# Patient Record
Sex: Female | Born: 1955 | Race: White | Hispanic: No | Smoking: Never smoker
Health system: Southern US, Community
[De-identification: ages and names within clinical notes are randomized; demographics above are authoritative.]

## PROBLEM LIST (undated history)

## (undated) DIAGNOSIS — E785 Hyperlipidemia, unspecified: Secondary | ICD-10-CM

## (undated) HISTORY — DX: Hyperlipidemia, unspecified: E78.5

---

## 1978-12-04 DIAGNOSIS — A159 Respiratory tuberculosis unspecified: Secondary | ICD-10-CM

## 1978-12-04 HISTORY — DX: Respiratory tuberculosis unspecified: A15.9

## 2020-08-11 ENCOUNTER — Other Ambulatory Visit: Payer: Self-pay

## 2020-08-11 ENCOUNTER — Encounter: Payer: Self-pay | Admitting: Cardiovascular Disease

## 2020-08-11 ENCOUNTER — Ambulatory Visit (INDEPENDENT_AMBULATORY_CARE_PROVIDER_SITE_OTHER): Admitting: Cardiovascular Disease

## 2020-08-11 VITALS — BP 121/78 | HR 83 | Ht 63.0 in | Wt 125.6 lb

## 2020-08-11 DIAGNOSIS — R0602 Shortness of breath: Secondary | ICD-10-CM | POA: Diagnosis not present

## 2020-08-11 DIAGNOSIS — E7800 Pure hypercholesterolemia, unspecified: Secondary | ICD-10-CM | POA: Diagnosis not present

## 2020-08-11 NOTE — Patient Instructions (Signed)
Medication Instructions:  No changes *If you need a refill on your cardiac medications before your next appointment, please call your pharmacy*   Lab Work: None ordered If you have labs (blood work) drawn today and your tests are completely normal, you will receive your results only by:  MyChart Message (if you have MyChart) OR  A paper copy in the mail If you have any lab test that is abnormal or we need to change your treatment, we will call you to review the results.   Testing/Procedures: Your physician has requested that you have an echocardiogram. Echocardiography is a painless test that uses sound waves to create images of your heart. It provides your doctor with information about the size and shape of your heart and how well your hearts chambers and valves are working. You may receive an ultrasound enhancing agent through an IV if needed to better visualize your heart during the echo.This procedure takes approximately one hour. There are no restrictions for this procedure. This will take place at the [address], [address].   Your physician has requested that you have an exercise tolerance test. For further information please visit [email or web address] Please also follow instruction sheet, as given. This will take place at [address] [facility], [address].  Do not drink or eat foods with caffeine for 24 hours before the test. (Chocolate, coffee, tea, or energy drinks)  If you use an inhaler, bring it with you to the test.  Do not smoke for 4 hours before the test.  Wear comfortable shoes and clothing. You will need a covid test prior and will need to wear a mask during this test.    Follow-Up: At [facility], you and your health needs are our priority.  As part of our continuing mission to provide you with exceptional heart care, we have created designated Provider Care Teams.  These Care Teams include your primary Cardiologist (physician) and Advanced Practice Providers  (APPs -  Physician Assistants and Nurse Practitioners) who all work together to provide you with the care you need, when you need it.  We recommend signing up for the patient portal called "MyChart".  Sign up information is provided on this After Visit Summary.  MyChart is used to connect with patients for Virtual Visits (Telemedicine).  Patients are able to view lab/test results, encounter notes, upcoming appointments, etc.  Non-urgent messages can be sent to your provider as well.   To learn more about what you can do with MyChart, go to [email or web address].    Your next appointment:   Follow up as needed with Dr. Royann Shivers.

## 2020-08-11 NOTE — Progress Notes (Signed)
Cardiology Office Note:    Date:  08/11/2020   ID:  Amarilys Lyles, DOB December 21, 1955, MRN [id number]  PCP:  Glenis Smoker, MD  [employer] Cardiologist:  No primary care provider on file.  [facility] Electrophysiologist:  None   Referring MD: Glenis Smoker, *   Chief Complaint  Patient presents with  . Shortness of Breath  Tammy Bryan is a 64 y.o. female who is being seen today for the evaluation of exertional dyspnea at the request of Glenis Smoker, *.   History of Present Illness:    Tammy Bryan is a 64 y.o. female with a hx of hyperlipidemia (recently started treatment with a statin) and low back pain, who is experienced several months of exertional dyspnea.  She only noticed problems with shortness of breath after moving to [place] from [place].  She primarily became aware of it when having to climb the stairs to her 1st floor apartment (previously reviewed on flat land, no stairs to her home).  This gets her exhausted every single time and has not improved despite the fact that she has tried to train herself.  She also notices shortness of breath if she climbs hills or inclines, but has no problem level ground.  Denies angina either at rest or with activity.  Used to have problems with occasional palpitations, never sustained, but recently these have not been an issue.  She has never experienced syncope, stroke/TIA symptoms, leg edema, claudication, has no problems with unexplained weight gain and generally feels quite well.  She has never smoked.  Her mother had valvular heart disease (possibly due to rheumatic fever) and had to valves replaced at age 12, passed away with congestive heart failure at age 49.  She has a sister that had a stroke, apparently due to carotid disease.  She does not have a personal history of hypertension, diabetes mellitus, stroke, PAD, CAD or other chronic illnesses.  She is quite lean with a BMI around 22.  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  She had x-ray follow-up for a while, none in the last 15 years or so.  She is relatively recently widowed and has moved to [place] [facility] to be close to her daughter, who is expected to deliver her 1st grandchild in about 3 weeks.  History reviewed. No pertinent past medical history.  History reviewed. No pertinent surgical history.  Current Medications: Current Meds  Medication Sig  . atorvastatin (LIPITOR) 20 MG tablet Take 20 mg by mouth at bedtime.  . cyclobenzaprine (FLEXERIL) 10 MG tablet Take 10 mg by mouth at bedtime as needed.  . meloxicam (MOBIC) 7.5 MG tablet Take 7.5 mg by mouth daily.  . traMADol (ULTRAM) 50 MG tablet Take 50 mg by mouth daily as needed.     Allergies:   Patient has no allergy information on record.   Social History   Socioeconomic History  . Marital status: Unknown    Spouse name: Not on file  . Number of children: Not on file  . Years of education: Not on file  . Highest education level: Not on file  Occupational History  . Not on file  Tobacco Use  . Smoking status: Never Smoker  . Smokeless tobacco: Current User  . Tobacco comment: medicinal vape at night  Substance and Sexual Activity  . Alcohol use: Not on file    Comment: once or twice a month  .  Drug use: Not on file  . Sexual activity: Never  Other Topics Concern  . Not on file  Social History Narrative  . Not on file   Social Determinants of Health   Financial Resource Strain:   . Difficulty of Paying Living Expenses: Not on file  Food Insecurity:   . Worried About Charity fundraiser in the Last Year: Not on file  . Ran Out of Food in the Last Year: Not on file  Transportation Needs:   . Lack of Transportation (Medical): Not on file  . Lack of Transportation (Non-Medical): Not on file  Physical Activity:   . Days of Exercise per Week: Not on file  . Minutes of Exercise per Session: Not  on file  Stress:   . Feeling of Stress : Not on file  Social Connections:   . Frequency of Communication with Friends and Family: Not on file  . Frequency of Social Gatherings with Friends and Family: Not on file  . Attends Religious Services: Not on file  . Active Member of Clubs or Organizations: Not on file  . Attends Archivist Meetings: Not on file  . Marital Status: Not on file     Family History: The patient's sister has a history of stroke and her mother has a history of valve replacement surgery at advanced age.  ROS:   Please see the history of present illness.     All other systems reviewed and are negative.  EKGs/Labs/Other Studies Reviewed:    The following studies were reviewed today: Notes from Dr. Lindell Noe, normal CBC and c-Met.  Lipid profile is not available.  EKG:  EKG is  ordered today.  The ekg ordered today demonstrates normal sinus rhythm and is a completely normal tracing  Recent Labs: No results found for requested labs within last 8760 hours.  Recent Lipid Panel No results found for: CHOL, TRIG, HDL, CHOLHDL, VLDL, LDLCALC, LDLDIRECT  Physical Exam:    VS:  BP 121/78   Pulse 83   Ht _0  (1.6 m)   Wt 125 lb 9.6 oz (57 kg)   SpO2 98%   BMI 22.25 kg/m     Wt Readings from Last 3 Encounters:  08/11/20 125 lb 9.6 oz (57 kg)     GEN: Appears lean, well nourished, well developed in no acute distress HEENT: Normal NECK: No JVD; No carotid bruits LYMPHATICS: No lymphadenopathy CARDIAC: RRR (a single premature beat is heard during examination), no murmurs, rubs, gallops.  2nd heart sounds sounds a little intensified, but she is also very thin. RESPIRATORY:  Clear to auscultation without rales, wheezing or rhonchi  ABDOMEN: Soft, non-tender, non-distended MUSCULOSKELETAL:  No edema; No deformity  SKIN: Warm and dry NEUROLOGIC:  Alert and oriented x 3 PSYCHIATRIC:  Normal affect   ASSESSMENT:    1. Shortness of breath    PLAN:      In order of problems listed above:  1. Exertional dyspnea: Hard to decide whether this is just due to deconditioning or may be due to underlying cardiac or pulmonary disease, since there are no other associated complaints or abnormal findings of substance on exam or ECG.  Recommend a treadmill stress test to get an objective measure of exercise capacity and screen for CAD with dyspnea as an anginal equivalent.  Schedule for echocardiogram to look for any structural heart disease. 2. Hypercholesterolemia: I do not have the most recent lipid profile, but the patient reports that after starting atorvastatin  the numbers "came right down".  If the treadmill stress test and echocardiogram showed normal findings, consider chest x-ray and pulmonary function tests with or without referral to a pulmonary specialist.  If significant abnormalities are identified on either the stress test or the echo, we will bring the patient back in for a follow-up appointment.   Medication Adjustments/Labs and Tests Ordered: Current medicines are reviewed at length with the patient today.  Concerns regarding medicines are outlined above.  Orders Placed This Encounter  Procedures  . EXERCISE TOLERANCE TEST (ETT)  . EKG 12-Lead  . ECHOCARDIOGRAM COMPLETE   No orders of the defined types were placed in this encounter.   Patient Instructions  Medication Instructions:  No changes *If you need a refill on your cardiac medications before your next appointment, please call your pharmacy*   Lab Work: None ordered If you have labs (blood work) drawn today and your tests are completely normal, you will receive your results only by: Marland Kitchen MyChart Message (if you have MyChart) OR . A paper copy in the mail If you have any lab test that is abnormal or we need to change your treatment, we will call you to review the results.   Testing/Procedures: Your physician has requested that you have an echocardiogram. Echocardiography  is a painless test that uses sound waves to create images of your heart. It provides your doctor with information about the size and shape of your heart and how well your heart's chambers and valves are working. You may receive an ultrasound enhancing agent through an IV if needed to better visualize your heart during the echo.This procedure takes approximately one hour. There are no restrictions for this procedure. This will take place at the [address], [address].   Your physician has requested that you have an exercise tolerance test. For further information please visit [email or web address]. Please also follow instruction sheet, as given. This will take place at [place], [address].  Do not drink or eat foods with caffeine for 24 hours before the test. (Chocolate, coffee, tea, or energy drinks)  If you use an inhaler, bring it with you to the test.  Do not smoke for 4 hours before the test.  Wear comfortable shoes and clothing. You will need a covid test prior and will need to wear a mask during this test.    Follow-Up: At [facility], you and your health needs are our priority.  As part of our continuing mission to provide you with exceptional heart care, we have created designated Provider Care Teams.  These Care Teams include your primary Cardiologist (physician) and Advanced Practice Providers (APPs -  Physician Assistants and Nurse Practitioners) who all work together to provide you with the care you need, when you need it.  We recommend signing up for the patient portal called "MyChart".  Sign up information is provided on this After Visit Summary.  MyChart is used to connect with patients for Virtual Visits (Telemedicine).  Patients are able to view lab/test results, encounter notes, upcoming appointments, etc.  Non-urgent messages can be sent to your provider as well.   To learn more about what you can do with MyChart, go to [email or web address].    Your next  appointment:   Follow up as needed with Dr. Sallyanne Kuster.    Signed, Sanda Klein, MD  08/11/2020 11:14 AM    [facility]

## 2020-08-12 ENCOUNTER — Ambulatory Visit (HOSPITAL_COMMUNITY): Attending: Cardiology

## 2020-08-12 DIAGNOSIS — R0602 Shortness of breath: Secondary | ICD-10-CM | POA: Diagnosis present

## 2020-08-12 LAB — ECHOCARDIOGRAM COMPLETE
Area-P 1/2: 3.91 cm2
S' Lateral: 2.4 cm

## 2020-08-13 ENCOUNTER — Telehealth (HOSPITAL_COMMUNITY): Payer: Self-pay

## 2020-08-13 NOTE — Telephone Encounter (Signed)
Encounter complete. 

## 2020-08-14 ENCOUNTER — Other Ambulatory Visit (HOSPITAL_COMMUNITY)
Admission: RE | Admit: 2020-08-14 | Discharge: 2020-08-14 | Disposition: A | Discharge status: Home / Self Care | Source: Ambulatory Visit | Attending: Cardiovascular Disease | Admitting: Cardiovascular Disease

## 2020-08-14 DIAGNOSIS — Z20822 Contact with and (suspected) exposure to covid-19: Secondary | ICD-10-CM | POA: Insufficient documentation

## 2020-08-14 DIAGNOSIS — Z01818 Encounter for other preprocedural examination: Secondary | ICD-10-CM | POA: Insufficient documentation

## 2020-08-14 LAB — SARS CORONAVIRUS 2 (TAT 6-24 HRS): SARS Coronavirus 2: NEGATIVE

## 2020-08-18 ENCOUNTER — Ambulatory Visit (HOSPITAL_COMMUNITY)
Admission: RE | Admit: 2020-08-18 | Discharge: 2020-08-18 | Disposition: A | Discharge status: Home / Self Care | Source: Ambulatory Visit | Attending: Cardiology | Admitting: Cardiology

## 2020-08-18 ENCOUNTER — Other Ambulatory Visit: Payer: Self-pay

## 2020-08-18 DIAGNOSIS — R0602 Shortness of breath: Secondary | ICD-10-CM | POA: Diagnosis present

## 2020-08-18 LAB — EXERCISE TOLERANCE TEST
Estimated workload: 9.5 METS
Exercise duration (min): 7 min
Exercise duration (sec): 37 s
MPHR: 156 {beats}/min
Peak HR: 150 {beats}/min
Percent HR: 96 %
Rest HR: 86 {beats}/min

## 2020-10-13 ENCOUNTER — Other Ambulatory Visit: Payer: Self-pay

## 2020-10-13 ENCOUNTER — Encounter: Payer: Self-pay | Admitting: Physical Therapy

## 2020-10-13 ENCOUNTER — Ambulatory Visit: Attending: Family Medicine | Admitting: Physical Therapy

## 2020-10-13 DIAGNOSIS — G8929 Other chronic pain: Secondary | ICD-10-CM | POA: Diagnosis present

## 2020-10-13 DIAGNOSIS — M545 Low back pain, unspecified: Secondary | ICD-10-CM | POA: Insufficient documentation

## 2020-10-13 DIAGNOSIS — R293 Abnormal posture: Secondary | ICD-10-CM | POA: Insufficient documentation

## 2020-10-13 DIAGNOSIS — M542 Cervicalgia: Secondary | ICD-10-CM

## 2020-10-13 NOTE — Patient Instructions (Signed)
Access Code: [email or web address] URL: [email or web address] Date: 10/13/2020 Prepared by: Raynelle Fanning  Exercises Supine Hamstring Stretch with Strap - 2 x daily - 7 x weekly - 1 sets - 3 reps - 60 sec hold Supine Piriformis Stretch with Leg Straight - 2 x daily - 7 x weekly - 1 sets - 3 reps - 30-60 sec sec hold Seated Upper Trapezius Stretch - 2 x daily - 7 x weekly - 1 sets - 3 reps - 30-60 sec hold Seated Levator Scapulae Stretch - 2 x daily - 7 x weekly - 1 sets - 3 reps - 30-60 sec hold  Patient Education Trigger Point Dry Needling

## 2020-10-13 NOTE — Therapy (Signed)
[facility] [address] [place], [place], [address] Phone: [phone]   Fax:  [phone]  Physical Therapy Evaluation  Patient Details  Name: Tammy Bryan MRN: [id number] Date of Birth: 09-Sep-1956 Referring Provider (PT): Garth Bigness MD   Encounter Date: 10/13/2020   PT End of Session - 10/13/20 0931    Visit Number 1    Number of Visits 8    Date for PT Re-Evaluation 12/08/20    Authorization Type [facility]    PT Start Time 0932    PT Stop Time 1016    PT Time Calculation (min) 44 min    Activity Tolerance Patient tolerated treatment well    Behavior During Therapy [facility] for tasks assessed/performed           History reviewed. No pertinent past medical history.  History reviewed. No pertinent surgical history.  There were no vitals filed for this visit.    Subjective Assessment - 10/13/20 0935    Subjective Patient was being seen in PT for back and neck in Jan 2020 in [place]. She moved to [facility] in May. Chronic back pain for decades. Her neck started hurting about 2 years ago. PT and meds have helped. She is here to see if she can get off meds. No neck pain at the moment but it affects sleep when it flares up. Usually 1-2x/month    Pertinent History chronic back and neck pain, OA, HOH, c-section    Diagnostic tests xrays    Patient Stated Goals to decrease pain    Currently in Pain? Yes    Pain Score 1     Pain Location Back    Pain Orientation Right    Pain Descriptors / Indicators Aching;Dull    Pain Radiating Towards intermittently into bil legs    Pain Onset More than a month ago    Pain Frequency Intermittent    Aggravating Factors  overdoing it with certain movements; inactivity, vacuuming    Pain Relieving Factors meds              [facility] PT Assessment - 10/13/20 0001      Assessment   Medical Diagnosis lumbar pain; neck pain    Referring Provider (PT) Garth Bigness MD     Onset Date/Surgical Date 04/03/20    Hand Dominance Right    Prior Therapy yes in [place]      Precautions   Precautions None      Restrictions   Weight Bearing Restrictions No      Balance Screen   Has the patient fallen in the past 6 months No    Has the patient had a decrease in activity level because of a fear of falling?  No    Is the patient reluctant to leave their home because of a fear of falling?  No      Home Environment   Living Environment Private residence    Living Arrangements Alone    Type of Home Apartment    Home Access Stairs to enter    Entrance Stairs-Number of Steps --   one flight     Prior Function   Level of Independence Independent    Vocation Retired    Leisure new grandchild; used to do yoga      Observation/Other Assessments   Focus on Therapeutic Outcomes (FOTO)  40% limited (goal 34%)      Posture/Postural Control   Posture/Postural Control Postural limitations    Postural  Limitations Forward head;Increased thoracic kyphosis    Posture Comments left thoracic scoliosis; depressed right shoulder; even scapula and pelvic landmarks      ROM / Strength   AROM / PROM / Strength AROM;Strength      AROM   AROM Assessment Site Cervical;Lumbar    Cervical Flexion 90%    Cervical Extension full    Cervical - Right Side Bend 23    Cervical - Left Side Bend 30    Cervical - Right Rotation 50    Cervical - Left Rotation 30    Lumbar Flexion full    Lumbar Extension full    Lumbar - Right Side Bend WFL    Lumbar - Left Side Bend WFL    Lumbar - Right Rotation 75%    Lumbar - Left Rotation full      Strength   Overall Strength Comments cervical 5/5, BLE grossly 5/5 except bil hip flexor 4+/5, bil hip ext 4/5      Flexibility   Soft Tissue Assessment /Muscle Length yes    Hamstrings mild right    Piriformis right      Palpation   Spinal mobility decreased PA right lumbar     Palpation comment tightness in bil UT; left rhomboids, right  QL/lumbar                       Objective measurements completed on examination: See above findings.               PT Education - 10/13/20 1235    Education Details HEP; DN ED    Person(s) Educated Patient    Methods Explanation;Demonstration;Handout    Comprehension Verbalized understanding;Returned demonstration            PT Short Term Goals - 10/13/20 1247      PT SHORT TERM GOAL #1   Title Ind with intial HEP    Time 4    Period Weeks    Status New    Target Date 11/10/20      PT SHORT TERM GOAL #2   Title decreased LBP by 25-50% with ADLS.    Time 4    Period Weeks    Status New      PT SHORT TERM GOAL #3   Title Patient compliant with walking program    Time 4    Period Weeks    Status New             PT Long Term Goals - 10/13/20 1249      PT LONG TERM GOAL #1   Title Decreased LBP with vacuuming and caring for her grandson by >50%    Time 8    Period Weeks    Status New    Target Date 12/08/20      PT LONG TERM GOAL #2   Title Improved hip strength to 5/5 to ease ADLS including climbing stairs.    Time 8    Period Weeks    Status New      PT LONG TERM GOAL #3   Title improved FOTO limitations to <= 34%    Baseline 40% limited    Time 8    Period Weeks    Status New      PT LONG TERM GOAL #4   Title Ind with advanced HEP to maintain gains made in PT    Time 8    Period Weeks    Status New  PT LONG TERM GOAL #5   Title Decreased incidence of neck pain by 50% or more.    Time 8    Period Weeks    Status New                  Plan - 10/13/20 1238    Clinical Impression Statement Patient presents with c/o low back and intermittent neck pain which are both chronic. She takes pain meds daily for both and would like to decrease this if possible. She has a left thoracic scoliosis affecting her posture and cervical rotation and SB. She has some tightness in her right hip and bil lumbar spine but ROM is  functional. She has weakness in bil hips and weakness in her core with MMT. She reports back pain with vacuuming and with caring for her new grandson. Patient used to do yoga and walk regularly, but doesn't now. She was encouraged to start a walking program.  She will benefit from PT to address these deficits.    Personal Factors and Comorbidities Time since onset of injury/illness/exacerbation;Fitness    Examination-Activity Limitations Lift    Stability/Clinical Decision Making Stable/Uncomplicated    Clinical Decision Making Low    Rehab Potential Good    PT Frequency 1x / week    PT Duration 8 weeks    PT Treatment/Interventions ADLs/Self Care Home Management;Cryotherapy;Electrical Stimulation;Moist Heat;Therapeutic exercise;Therapeutic activities;Patient/family education;Manual techniques;Dry needling;Taping    PT Next Visit Plan Trial of DN in neck (pt doesn't want in back); hip and core strengthening, cervical stretching; postural strengthening    PT Home Exercise Plan [email or web address]    Consulted and Agree with Plan of Care Patient           Patient will benefit from skilled therapeutic intervention in order to improve the following deficits and impairments:  Increased muscle spasms, Postural dysfunction, Pain, Decreased strength, Decreased range of motion  Visit Diagnosis: Chronic bilateral low back pain, unspecified whether sciatica present - Plan: PT plan of care cert/re-cert  Cervicalgia - Plan: PT plan of care cert/re-cert  Abnormal posture - Plan: PT plan of care cert/re-cert     Problem List There are no problems to display for this patient.   Raynelle Fanning Bowie Delia PT 10/13/2020, 12:59 PM   [facility] [address] [place], [place], [address] Phone: [phone]   Fax:  [phone]  Name: Tammy Bryan MRN: [id number] Date of Birth: 08/04/1956

## 2020-10-18 ENCOUNTER — Other Ambulatory Visit: Payer: Self-pay

## 2020-10-18 ENCOUNTER — Ambulatory Visit: Admitting: Physical Therapy

## 2020-10-18 ENCOUNTER — Encounter: Payer: Self-pay | Admitting: Physical Therapy

## 2020-10-18 DIAGNOSIS — M545 Low back pain, unspecified: Secondary | ICD-10-CM | POA: Diagnosis not present

## 2020-10-18 DIAGNOSIS — M542 Cervicalgia: Secondary | ICD-10-CM

## 2020-10-18 DIAGNOSIS — R293 Abnormal posture: Secondary | ICD-10-CM

## 2020-10-18 NOTE — Therapy (Signed)
[facility] [address], [address] [place], [place], [address] Phone: [phone]   Fax:  [phone]  Physical Therapy Treatment  Patient Details  Name: Tammy Bryan MRN: [id number] Date of Birth: [birth year] Referring Provider (PT): Garth Bigness MD   Encounter Date: 10/18/2020   PT End of Session - 10/18/20 1257    Visit Number 2    Number of Visits 8    Date for PT Re-Evaluation 12/08/20    Authorization Type [employer]    PT Start Time 0935    PT Stop Time 1025    PT Time Calculation (min) 50 min    Activity Tolerance Patient tolerated treatment well    Behavior During Therapy [facility] for tasks assessed/performed           History reviewed. No pertinent past medical history.  History reviewed. No pertinent surgical history.  There were no vitals filed for this visit.   Subjective Assessment - 10/18/20 0938    Subjective Interested in DN neck today.  2/10 pain Lt sided neck pain.    Pertinent History chronic back and neck pain, OA, HOH, c-section    Diagnostic tests xrays    Patient Stated Goals to decrease pain    Currently in Pain? Yes    Pain Score 2     Pain Location Neck    Pain Orientation Left    Pain Descriptors / Indicators Aching;Tightness;Dull    Pain Type Chronic pain    Pain Radiating Towards Lt neck/shoulder    Pain Onset More than a month ago    Pain Frequency Intermittent    Aggravating Factors  not sure    Pain Relieving Factors meds, using good pillow                             [facility] Adult PT Treatment/Exercise - 10/18/20 0001      Exercises   Exercises Neck;Shoulder      Neck Exercises: Machines for Strengthening   Nustep L2 x 5' PT present to discuss plan for session       Neck Exercises: Seated   Other Seated Exercise levator and upper trap stretch bil 1x30 sec each      Neck Exercises: Supine   Neck Retraction 5 reps;10 secs      Shoulder Exercises:  Supine   Horizontal ABduction Strengthening;Both;10 reps;Theraband    Theraband Level (Shoulder Horizontal ABduction) Level 2 (Red)    Diagonals Strengthening;Both;10 reps;Theraband    Theraband Level (Shoulder Diagonals) Level 2 (Red)      Shoulder Exercises: Standing   Horizontal ABduction Strengthening;Both;10 reps;Theraband    Theraband Level (Shoulder Horizontal ABduction) Level 2 (Red)    Horizontal ABduction Limitations HEP    Extension Strengthening;Both;10 reps;Theraband    Theraband Level (Shoulder Extension) Level 2 (Red)    Extension Limitations HEP    Row Strengthening;Both;10 reps;Theraband    Theraband Level (Shoulder Row) Level 2 (Red)    Row Limitations HEP      Modalities   Modalities Moist Heat      Moist Heat Therapy   Number Minutes Moist Heat 10 Minutes    Moist Heat Location Cervical      Manual Therapy   Manual Therapy Soft tissue mobilization    Soft tissue mobilization Lt upper trap and levator after DN            Trigger Point Dry Needling - 10/18/20 0001  Consent Given? Yes    Education Handout Provided Previously provided    Muscles Treated Head and Neck Upper trapezius;Levator scapulae    Other Dry Needling Lt only     Upper Trapezius Response Twitch reponse elicited;Palpable increased muscle length    Levator Scapulae Response Twitch response elicited;Palpable increased muscle length                  PT Short Term Goals - 10/13/20 1247      PT SHORT TERM GOAL #1   Title Ind with intial HEP    Time 4    Period Weeks    Status New    Target Date 11/10/20      PT SHORT TERM GOAL #2   Title decreased LBP by 25-50% with ADLS.    Time 4    Period Weeks    Status New      PT SHORT TERM GOAL #3   Title Patient compliant with walking program    Time 4    Period Weeks    Status New             PT Long Term Goals - 10/13/20 1249      PT LONG TERM GOAL #1   Title Decreased LBP with vacuuming and caring for her  grandson by >50%    Time 8    Period Weeks    Status New    Target Date 12/08/20      PT LONG TERM GOAL #2   Title Improved hip strength to 5/5 to ease ADLS including climbing stairs.    Time 8    Period Weeks    Status New      PT LONG TERM GOAL #3   Title improved FOTO limitations to <= 34%    Baseline 40% limited    Time 8    Period Weeks    Status New      PT LONG TERM GOAL #4   Title Ind with advanced HEP to maintain gains made in PT    Time 8    Period Weeks    Status New      PT LONG TERM GOAL #5   Title Decreased incidence of neck pain by 50% or more.    Time 8    Period Weeks    Status New                 Plan - 10/18/20 1258    Clinical Impression Statement Pt arrives for first follow up visit from evaluation.  PT reviewed neck stretches as Pt was performing incorrectly.  PT gave red band ther ex for scapular stab in supine and standing with neck retraction cueing.  HEP updated for standing red band scapular series. Pt needed cues for eccentric control with these.  Lt upper trap and levator was DN to address trigger points with good twitch/release and improved neck rotation mobility end of session.  Heat used for soreness end of session.  PT to balance time between neck and lumbar diagnoses over next several appointments.    Rehab Potential Good    PT Frequency 1x / week    PT Duration 8 weeks    PT Treatment/Interventions ADLs/Self Care Home Management;Cryotherapy;Electrical Stimulation;Moist Heat;Therapeutic exercise;Therapeutic activities;Patient/family education;Manual techniques;Dry needling;Taping    PT Next Visit Plan f/u on Lt cervical DN and red band standing scapular ther ex/neck stretches, upper t-spine mobs and STM cervical spine, intro standing posture alignment and progress hip and  core strength    PT Home Exercise Plan [email or web address]    Consulted and Agree with Plan of Care Patient           Patient will benefit from skilled therapeutic  intervention in order to improve the following deficits and impairments:     Visit Diagnosis: Cervicalgia  Abnormal posture     Problem List There are no problems to display for this patient.   Loistine Simas Saad Buhl, PT 10/18/20 1:02 PM   [facility] [address] [place], [place], [address] Phone: [phone]   Fax:  [phone]  Name: Tammy Bryan MRN: [id number] Date of Birth: Apr 30, 1956

## 2020-10-20 ENCOUNTER — Ambulatory Visit (INDEPENDENT_AMBULATORY_CARE_PROVIDER_SITE_OTHER)

## 2020-10-20 ENCOUNTER — Encounter: Payer: Self-pay | Admitting: Pulmonary Disease

## 2020-10-20 ENCOUNTER — Ambulatory Visit (INDEPENDENT_AMBULATORY_CARE_PROVIDER_SITE_OTHER): Admitting: Pulmonary Disease

## 2020-10-20 ENCOUNTER — Other Ambulatory Visit: Payer: Self-pay

## 2020-10-20 VITALS — BP 118/64 | HR 89 | Temp 97.2°F | Ht 63.0 in | Wt 124.4 lb

## 2020-10-20 DIAGNOSIS — R0609 Other forms of dyspnea: Secondary | ICD-10-CM

## 2020-10-20 DIAGNOSIS — J302 Other seasonal allergic rhinitis: Secondary | ICD-10-CM | POA: Diagnosis not present

## 2020-10-20 DIAGNOSIS — R0600 Dyspnea, unspecified: Secondary | ICD-10-CM

## 2020-10-20 MED ORDER — BREO ELLIPTA 100-25 MCG/INH IN AEPB: 1.0000 | INHALATION_SPRAY | Freq: Every day | RESPIRATORY_TRACT | 11 refills | Status: DC

## 2020-10-20 MED ORDER — ALBUTEROL SULFATE HFA 108 (90 BASE) MCG/ACT IN AERS: 1.0000 | INHALATION_SPRAY | RESPIRATORY_TRACT | 11 refills | Status: DC | PRN

## 2020-10-20 NOTE — Patient Instructions (Addendum)
Nice to meet you!  Use Breo 1 puff daily to treat presumed asthma.  I would hope that she would see improvement in symptoms of shortness of breath with exertion over the course of the next several weeks.  If you can, please send me a message in 6 to 8 weeks to let me know how this is treating you.  Use albuterol 20 to 30 minutes prior to any planned exercise to see if this helps improve your breathing.  Unfortunately, the computer did not give any chances to which your insurance would prefer.  If the co-pay for Virgel Bouquet is too high, please let us know and we can prescribe an alternative.  We will get a chest x-ray today.  Please schedule breathing (pulmonary function) tests in the coming weeks.  This will give [place] clues as to if other things are contributing to your shortness of breath.  It may also further strengthen the argument that asthma is the correct diagnosis.  Return to clinic for follow-up in 3 months with Dr. Judeth Horn.

## 2020-10-20 NOTE — Progress Notes (Signed)
@Patient  ID: , female    DOB: [birth year], 64 y.o.   MRN: [id number]  Chief Complaint  Patient presents with  . Consult    Shortness of breath with exertion for past couple years    Referring provider: [id number], *  HPI:   64 year old woman with past medical history of well treated TB in 1980 whom are seen in consultation at the request of [place], MD for evaluation of dyspnea exertion.  Note from referring provider as well as cardiology provider reviewed.  Dyspnea on exertion present for several years.  Present when lived in [place] but moved a couple of years ago to [place] to be with her family.  She does okay on flat surfaces at a moderate pace but even that generates some dyspnea on exertion.  Walking at a vigorous pace and especially going up inclines or steps yields severe dyspnea on exertion.  This is relieved with rest.  Minimal to no cough.  She does endorse seasonal allergies with runny nose.  She notes that as a child she was very short of breath with PE and physical activity.  She has had kids with complaint of muscle fatigue when she would be limited largely by dyspnea.  She denies significant recurrent upper respiratory illnesses or pneumonias.  She notes that she was diagnosed with TB in 1980 after living in college at the roommate who had recently immigrated from [place].  She was treated for months with multiple drug therapy and reports clearance.  She had serial x-rays for some time without progression per her report.  Work-up to date that I can view for her dyspnea includes echocardiogram which on my review is normal.  Also includes exercise treadmill stress test which is normal per her report on my review.  She has had no lung imaging for her complaints.  She is using albuterol inhaler relatively infrequently with minimal to mild improvement in her dyspnea.  PMH: TB treated with multidrug therapy in 1980 - 1981 Surgical history:  History reviewed. No pertinent surgical history. Family history: No history of significant lung disease or respiratory issues Social history: Never smoker, moved to [place]1975 from [place] to be near her daughter couple years ago   [place] / Pulmonary Flowsheets:   ACT:  No flowsheet data found.  MMRC: No flowsheet data found.  Epworth:  No flowsheet data found.  Tests:   FENO:  No results found for: NITRICOXIDE  PFT: No flowsheet data found.  WALK:  No flowsheet data found.  Imaging: None for review, last reported chest x-ray 20 years ago  Lab Results:  CBC No results found for: WBC, RBC, HGB, HCT, PLT, MCV, MCH, MCHC, RDW, LYMPHSABS, MONOABS, EOSABS, BASOSABS  BMET No results found for: NA, K, CL, CO2, GLUCOSE, BUN, CREATININE, CALCIUM, GFRNONAA, GFRAA  BNP No results found for: BNP  ProBNP No results found for: PROBNP  Specialty Problems    None      No Known Allergies  Immunization History  Administered Date(s) Administered  . Influenza,inj,Quad PF,6+ Mos 08/24/2020  . PFIZER SARS-COV-2 Vaccination 02/06/2020, 02/26/2020    Past Medical History:  Diagnosis Date  . Hyperlipidemia   . Tuberculosis 1980    Tobacco History: Social History   Tobacco Use  Smoking Status Never Smoker  Smokeless Tobacco Current User  Tobacco Comment   medicinal vape at night   Ready to quit: Not Answered Counseling given: Not Answered Comment: medicinal vape at night   Continue  to not smoke  Outpatient Encounter Medications as of 10/20/2020  Medication Sig  . albuterol (VENTOLIN HFA) 108 (90 Base) MCG/ACT inhaler Inhale 1 puff into the lungs every 4 (four) hours as needed for wheezing or shortness of breath.  Marland Kitchen atorvastatin (LIPITOR) 20 MG tablet Take 20 mg by mouth at bedtime.  . cyclobenzaprine (FLEXERIL) 10 MG tablet Take 10 mg by mouth at bedtime as needed.  . meloxicam (MOBIC) 7.5 MG tablet Take 7.5 mg by mouth daily.  . traMADol  (ULTRAM) 50 MG tablet Take 50 mg by mouth daily as needed.  . [DISCONTINUED] albuterol (VENTOLIN HFA) 108 (90 Base) MCG/ACT inhaler Inhale 1 puff into the lungs every 4 (four) hours as needed for wheezing or shortness of breath.  . fluticasone furoate-vilanterol (BREO ELLIPTA) 100-25 MCG/INH AEPB Inhale 1 puff into the lungs daily.   No facility-administered encounter medications on file as of 10/20/2020.     Review of Systems  Review of Systems  No chest pain with exertion.  No orthopnea or PND.  Comprehensive review of systems otherwise negative.  Physical Exam  BP 118/64 (BP Location: Left Arm, Cuff Size: Normal)   Pulse 89   Temp (!) 97.2 F (36.2 C) (Other (Comment)) Comment (Src): wrist  Ht 5\' 3"  (1.6 m)   Wt 124 lb 6.4 oz (56.4 kg)   SpO2 96% Comment: Room air  BMI 22.04 kg/m   Wt Readings from Last 5 Encounters:  10/20/20 124 lb 6.4 oz (56.4 kg)  08/11/20 125 lb 9.6 oz (57 kg)    BMI Readings from Last 5 Encounters:  10/20/20 22.04 kg/m  08/11/20 22.25 kg/m     Physical Exam General:, Well-appearing, no acute distress Eyes: EOMI, no icterus Neck: Supple, no JVP appreciated Respiratory: Clear aspiration bilaterally, no wheezes or crackles Cardiovascular: Borderline tachycardia with heart rate in 90s during exam, no murmurs Abdomen: Nondistended, bowel sounds present MSK: No joint effusion, no synovitis Neuro: Normal gait, no weakness Psych: Normal mood, full affect   Assessment & Plan:   Dyspnea on exertion: Present for many years.  Prior cardiac work-up includes normal TTE and unremarkable exercise stress test.  No readily available pulmonary imaging.  Will obtain chest x-ray today.  PFTs ordered to be obtained in the coming days to elucidate possible abnormal pulmonary function contributing to her symptoms. Possible asthma is largest contributor given her atopic symptoms and symptoms during childhood.  Initiate ICS/LABA therapy with mid dose of Breo and  provided as needed albuterol.  Seasonal allergies: Continue to monitor.  Likely contributing to asthma symptoms.  See above.   Return in about 3 months (around 01/20/2021).   01/22/2021, MD 10/20/2020

## 2020-10-21 NOTE — Progress Notes (Signed)
CXR clear

## 2020-10-25 ENCOUNTER — Encounter: Payer: Self-pay | Admitting: Physical Therapy

## 2020-10-25 ENCOUNTER — Other Ambulatory Visit: Payer: Self-pay

## 2020-10-25 ENCOUNTER — Ambulatory Visit: Admitting: Physical Therapy

## 2020-10-25 DIAGNOSIS — M542 Cervicalgia: Secondary | ICD-10-CM

## 2020-10-25 DIAGNOSIS — R293 Abnormal posture: Secondary | ICD-10-CM

## 2020-10-25 DIAGNOSIS — G8929 Other chronic pain: Secondary | ICD-10-CM

## 2020-10-25 DIAGNOSIS — M545 Low back pain, unspecified: Secondary | ICD-10-CM | POA: Diagnosis not present

## 2020-10-25 NOTE — Patient Instructions (Signed)
Access Code: [email or web address] URL: [email or web address] Date: 10/25/2020 Prepared by: Raynelle Fanning  Exercises Supine Hamstring Stretch with Strap - 2 x daily - 7 x weekly - 1 sets - 3 reps - 60 sec hold Supine Piriformis Stretch with Leg Straight - 2 x daily - 7 x weekly - 1 sets - 3 reps - 30-60 sec sec hold Seated Upper Trapezius Stretch - 2 x daily - 7 x weekly - 1 sets - 3 reps - 30-60 sec hold Seated Levator Scapulae Stretch - 2 x daily - 7 x weekly - 1 sets - 3 reps - 30-60 sec hold Supine Cervical Retraction with Towel - 1 x daily - 7 x weekly - 3 sets - 10 reps Standing Shoulder Horizontal Abduction with Resistance - 1 x daily - 7 x weekly - 1 sets - 10 reps Standing Row with Anchored Resistance - 1 x daily - 7 x weekly - 1 sets - 10 reps Standing Shoulder Extension with Resistance - 1 x daily - 7 x weekly - 1 sets - 10 reps Standing Quadratus Lumborum Stretch with Doorway - 2 x daily - 7 x weekly - 1 sets - 2 reps - 30-60 sec hold

## 2020-10-25 NOTE — Therapy (Addendum)
[facility] [address] [place], [place], [address] Phone: [phone]   Fax:  [phone]  Physical Therapy Treatment  Patient Details  Name: Tammy Bryan MRN: [id number] Date of Birth: 1956-05-04 Referring Provider (PT): Sela Hilding MD   Encounter Date: 10/25/2020   PT End of Session - 10/25/20 1100    Visit Number 3    Number of Visits 8    Date for PT Re-Evaluation 12/08/20    Authorization Type [employer]    PT Start Time 1100    PT Stop Time 1145    PT Time Calculation (min) 45 min    Activity Tolerance Patient tolerated treatment well    Behavior During Therapy [facility] for tasks assessed/performed           Past Medical History:  Diagnosis Date  . Hyperlipidemia   . Tuberculosis 1980    History reviewed. No pertinent surgical history.  There were no vitals filed for this visit.   Subjective Assessment - 10/25/20 1102    Subjective I would like to do DN in back today    Patient Stated Goals to decrease pain    Currently in Pain? Yes    Pain Score 1     Pain Location Neck    Pain Orientation Left    Pain Descriptors / Indicators Sore    Multiple Pain Sites Yes    Pain Score 2    Pain Location Back    Pain Orientation Right    Pain Descriptors / Indicators Aching                             [facility] Adult PT Treatment/Exercise - 10/25/20 0001      Shoulder Exercises: Standing   Horizontal ABduction Strengthening;Both;10 reps;Theraband    Theraband Level (Shoulder Horizontal ABduction) Level 2 (Red)    Extension Strengthening;Both;10 reps;Theraband    Theraband Level (Shoulder Extension) Level 2 (Red)    Row Strengthening;Both;10 reps;Theraband    Theraband Level (Shoulder Row) Level 2 (Red)      Manual Therapy   Manual Therapy Soft tissue mobilization;Joint mobilization    Manual therapy comments Skilled palpation and monitoring of soft tissues during DN    Joint  Mobilization to lumbar and cervical bil PA    Soft tissue mobilization to bil lumbar      Neck Exercises: Stretches   Upper Trapezius Stretch Right;Left;1 rep;30 seconds    Levator Stretch Right;Left;1 rep;30 seconds    Other Neck Stretches Standing QL stretch in doorway x 30 sec bil            Trigger Point Dry Needling - 10/25/20 0001    Consent Given? Yes    Education Handout Provided Previously provided    Muscles Treated Back/Hip Erector spinae;Quadratus lumborum    Dry Needling Comments bil    Erector spinae Response Twitch response elicited;Palpable increased muscle length    Quadratus Lumborum Response Twitch response elicited;Palpable increased muscle length                PT Education - 10/25/20 1537    Education Details QL Stretch added    Person(s) Educated Patient    Methods Explanation;Demonstration;Handout    Comprehension Verbalized understanding;Returned demonstration            PT Short Term Goals - 10/25/20 1152      PT SHORT TERM GOAL #1   Title Ind with intial  HEP    Status Partially Met             PT Long Term Goals - 10/13/20 1249      PT LONG TERM GOAL #1   Title Decreased LBP with vacuuming and caring for her grandson by >50%    Time 8    Period Weeks    Status New    Target Date 12/08/20      PT LONG TERM GOAL #2   Title Improved hip strength to 5/5 to ease ADLS including climbing stairs.    Time 8    Period Weeks    Status New      PT LONG TERM GOAL #3   Title improved FOTO limitations to <= 34%    Baseline 40% limited    Time 8    Period Weeks    Status New      PT LONG TERM GOAL #4   Title Ind with advanced HEP to maintain gains made in PT    Time 8    Period Weeks    Status New      PT LONG TERM GOAL #5   Title Decreased incidence of neck pain by 50% or more.    Time 8    Period Weeks    Status New                 Plan - 10/25/20 1148    Clinical Impression Statement Patient with very good  response to DN in neck and requested the same today for low back. She responded well bil today reporting no pain at end of session. QL stretch issued for HEP. LTGs are ongoing.    PT Frequency 1x / week    PT Duration 8 weeks    PT Treatment/Interventions ADLs/Self Care Home Management;Cryotherapy;Electrical Stimulation;Moist Heat;Therapeutic exercise;Therapeutic activities;Patient/family education;Manual techniques;Dry needling;Taping    PT Next Visit Plan Asses lumbar DN, standing posture alignment and progress hip and core strength, continue upper back strength also    PT Home Exercise Plan [email or web address]           Patient will benefit from skilled therapeutic intervention in order to improve the following deficits and impairments:  Increased muscle spasms, Postural dysfunction, Pain, Decreased strength, Decreased range of motion  Visit Diagnosis: Cervicalgia  Abnormal posture  Chronic bilateral low back pain, unspecified whether sciatica present     Problem List There are no problems to display for this patient.  Almyra Free Estle Huguley PT 10/25/2020, 3:38 PM  [facility] [address], [place], [place], [address] Phone: [phone]   Fax:  [phone]  Name: Milan Clare MRN: [id number] Date of Birth: 1956-02-05

## 2020-11-01 ENCOUNTER — Ambulatory Visit: Admitting: Physical Therapy

## 2020-11-01 ENCOUNTER — Encounter: Payer: Self-pay | Admitting: Physical Therapy

## 2020-11-01 ENCOUNTER — Other Ambulatory Visit: Payer: Self-pay

## 2020-11-01 DIAGNOSIS — M542 Cervicalgia: Secondary | ICD-10-CM

## 2020-11-01 DIAGNOSIS — R293 Abnormal posture: Secondary | ICD-10-CM

## 2020-11-01 DIAGNOSIS — G8929 Other chronic pain: Secondary | ICD-10-CM

## 2020-11-01 DIAGNOSIS — M545 Low back pain, unspecified: Secondary | ICD-10-CM | POA: Diagnosis not present

## 2020-11-01 NOTE — Therapy (Signed)
[facility] [address] [place], [place], [address] Phone: [phone]   Fax:  [phone]  Physical Therapy Treatment  Patient Details  Name: Tammy Bryan MRN: [id number] Date of Birth: 08-05-1956 Referring Provider (PT): Garth Bigness MD   Encounter Date: 11/01/2020   PT End of Session - 11/01/20 1108    Visit Number 4    Number of Visits 8    Date for PT Re-Evaluation 12/08/20    Authorization Type [employer]    PT Start Time 1100    PT Stop Time 1144    PT Time Calculation (min) 44 min    Activity Tolerance Patient tolerated treatment well    Behavior During Therapy [facility] for tasks assessed/performed           Past Medical History:  Diagnosis Date  . Hyperlipidemia   . Tuberculosis 1980    History reviewed. No pertinent surgical history.  There were no vitals filed for this visit.   Subjective Assessment - 11/01/20 1109    Subjective Neck feels good. right low back no change since DN. hurts when do exercises.    Pertinent History chronic back and neck pain, OA, HOH, c-section    Diagnostic tests xrays    Patient Stated Goals to decrease pain    Currently in Pain? Yes    Pain Score 0-No pain    Pain Score 1    Pain Location Back    Pain Orientation Right    Pain Descriptors / Indicators Aching                             [facility] Adult PT Treatment/Exercise - 11/01/20 0001      Exercises   Exercises Lumbar      Lumbar Exercises: Supine   Ab Set 10 reps    AB Set Limitations difficulty isolating TA; used Kegel 5 sec hold x 5; and VCs and TCs; Reveiwed diaphragmatic breathing with double time exhale vs inhale; also used ball with press in hooklying to engage abdominals 10 reps x 3-5 sec    Pelvic Tilt 5 reps      Lumbar Exercises: Prone   Straight Leg Raise 10 reps    Opposite Arm/Leg Raise Right arm/Left leg;Left arm/Right leg;5 reps;3 seconds    Other Prone Lumbar  Exercises pelvic press series 5 x 5 sec hold then 5 ea for unilateral and bil knee bends      Lumbar Exercises: Quadruped   Straight Leg Raise 10 reps    Straight Leg Raises Limitations left rotation with right leg lift      Shoulder Exercises: Standing   Extension Strengthening;Both;10 reps;Theraband    Theraband Level (Shoulder Extension) Level 2 (Red)    Row Strengthening;Both;10 reps;Theraband    Theraband Level (Shoulder Row) Level 2 (Red)    Row Limitations attempted more with abdominals engaged but diffculty for pt so went to supine                  PT Education - 11/01/20 1505    Education Details HEP progressed    Person(s) Educated Patient    Methods Explanation;Demonstration;Handout    Comprehension Returned demonstration;Verbalized understanding            PT Short Term Goals - 11/01/20 1507      PT SHORT TERM GOAL #1   Title Ind with intial HEP    Status Achieved  PT SHORT TERM GOAL #2   Title decreased LBP by 25-50% with ADLS.    Status On-going      PT SHORT TERM GOAL #3   Title Patient compliant with walking program    Baseline just started but not consistent    Status On-going             PT Long Term Goals - 10/13/20 1249      PT LONG TERM GOAL #1   Title Decreased LBP with vacuuming and caring for her grandson by >50%    Time 8    Period Weeks    Status New    Target Date 12/08/20      PT LONG TERM GOAL #2   Title Improved hip strength to 5/5 to ease ADLS including climbing stairs.    Time 8    Period Weeks    Status New      PT LONG TERM GOAL #3   Title improved FOTO limitations to <= 34%    Baseline 40% limited    Time 8    Period Weeks    Status New      PT LONG TERM GOAL #4   Title Ind with advanced HEP to maintain gains made in PT    Time 8    Period Weeks    Status New      PT LONG TERM GOAL #5   Title Decreased incidence of neck pain by 50% or more.    Time 8    Period Weeks    Status New                  Plan - 11/01/20 1505    Clinical Impression Statement Patient with no relief in low back after DN and reporting that standing shoulder exercises hurt her back. We worked on core engagment in supine and prone today. Patient with difficulty isolating transverse abdominus but demos fairly good strength with TE. Neck continues to do well.    PT Frequency 1x / week    PT Duration 8 weeks    PT Treatment/Interventions ADLs/Self Care Home Management;Cryotherapy;Electrical Stimulation;Moist Heat;Therapeutic exercise;Therapeutic activities;Patient/family education;Manual techniques;Dry needling;Taping    PT Next Visit Plan standing posture alignment and progress hip and core strength, continue upper back strength also    PT Home Exercise Plan [email or web address]           Patient will benefit from skilled therapeutic intervention in order to improve the following deficits and impairments:  Increased muscle spasms, Postural dysfunction, Pain, Decreased strength, Decreased range of motion  Visit Diagnosis: Chronic bilateral low back pain, unspecified whether sciatica present  Abnormal posture  Cervicalgia     Problem List There are no problems to display for this patient.   Raynelle Fanning Iretha Kirley PT 11/01/2020, 3:13 PM  [facility] [address], [address] [place], [place], [address] Phone: [phone]   Fax:  [phone]  Name: Tammy Bryan MRN: [id number] Date of Birth: 01-Aug-1956

## 2020-11-01 NOTE — Patient Instructions (Signed)
      Access Code: [email or web address] URL: [email or web address] Date: 11/01/2020 Prepared by: Raynelle Fanning  Exercises Supine Hamstring Stretch with Strap - 2 x daily - 7 x weekly - 1 sets - 3 reps - 60 sec hold Supine Piriformis Stretch with Leg Straight - 2 x daily - 7 x weekly - 1 sets - 3 reps - 30-60 sec sec hold Seated Upper Trapezius Stretch - 2 x daily - 7 x weekly - 1 sets - 3 reps - 30-60 sec hold Seated Levator Scapulae Stretch - 2 x daily - 7 x weekly - 1 sets - 3 reps - 30-60 sec hold Supine Cervical Retraction with Towel - 1 x daily - 7 x weekly - 3 sets - 10 reps Standing Shoulder Horizontal Abduction with Resistance - 1 x daily - 7 x weekly - 1 sets - 10 reps Standing Row with Anchored Resistance - 1 x daily - 7 x weekly - 1 sets - 10 reps Standing Shoulder Extension with Resistance - 1 x daily - 7 x weekly - 1 sets - 10 reps Standing Quadratus Lumborum Stretch with Doorway - 2 x daily - 7 x weekly - 1 sets - 2 reps - 30-60 sec hold Prone Alternating Arm and Leg Lifts - 1 x daily - 7 x weekly - 10 reps - 3 sets - 5 sec hold Quadruped Alternating Leg Extensions - 1 x daily - 7 x weekly - 10 reps - 3 sets - 3 sec hold Hooklying Sequential Leg March and Lower - 1 x daily - 7 x weekly - 3 sets - 10 reps

## 2020-11-10 ENCOUNTER — Ambulatory Visit: Attending: Family Medicine | Admitting: Physical Therapy

## 2020-11-10 ENCOUNTER — Other Ambulatory Visit: Payer: Self-pay

## 2020-11-10 ENCOUNTER — Encounter: Payer: Self-pay | Admitting: Physical Therapy

## 2020-11-10 DIAGNOSIS — M545 Low back pain, unspecified: Secondary | ICD-10-CM | POA: Diagnosis present

## 2020-11-10 DIAGNOSIS — G8929 Other chronic pain: Secondary | ICD-10-CM | POA: Insufficient documentation

## 2020-11-10 DIAGNOSIS — M542 Cervicalgia: Secondary | ICD-10-CM | POA: Diagnosis present

## 2020-11-10 DIAGNOSIS — R293 Abnormal posture: Secondary | ICD-10-CM | POA: Diagnosis present

## 2020-11-10 NOTE — Therapy (Signed)
[facility] [address], [place], [place], [address] Phone: [phone]   Fax:  [phone]  Physical Therapy Treatment and Discharge Summary  Patient Details  Name: Bayleigh Loflin MRN: [id number] Date of Birth: 12/20/1955 Referring Provider (PT): Sela Hilding MD   Encounter Date: 11/10/2020   PT End of Session - 11/10/20 0931    Visit Number 5    Number of Visits 8    Date for PT Re-Evaluation 12/08/20    Authorization Type [employer]    PT Start Time 0932    PT Stop Time 1021   heat x 10 at end   PT Time Calculation (min) 49 min    Activity Tolerance Patient tolerated treatment well    Behavior During Therapy [facility] for tasks assessed/performed           Past Medical History:  Diagnosis Date  . Hyperlipidemia   . Tuberculosis 1980    History reviewed. No pertinent surgical history.  There were no vitals filed for this visit.   Subjective Assessment - 11/10/20 0933    Subjective Still having left shoulder pain. Pain is intermittent. I'm happy with my progress and would like to end therapy.    Pertinent History chronic back and neck pain, OA, HOH, c-section    Patient Stated Goals to decrease pain    Currently in Pain? Yes    Pain Score 2     Pain Location Neck    Pain Orientation Left    Pain Descriptors / Indicators Sore              [facility] PT Assessment - 11/10/20 0001      Observation/Other Assessments   Focus on Therapeutic Outcomes (FOTO)  40% limited (goal 34%)      Strength   Overall Strength Comments bil hip ext 5-/5                         [facility] Adult PT Treatment/Exercise - 11/10/20 0001      Lumbar Exercises: Supine   Bent Knee Raise 5 reps    Bent Knee Raise Limitations good form and strength      Lumbar Exercises: Quadruped   Straight Leg Raise 5 reps    Straight Leg Raises Limitations left rotation with right leg lift      Modalities   Modalities Moist  Heat      Moist Heat Therapy   Number Minutes Moist Heat 10 Minutes    Moist Heat Location Cervical      Manual Therapy   Manual Therapy Soft tissue mobilization    Manual therapy comments Skilled palpation and monitoring of soft tissues during DN    Soft tissue mobilization to left UT, levator and cervical paraspinals            Trigger Point Dry Needling - 11/10/20 0001    Consent Given? Yes    Education Handout Provided Previously provided    Muscles Treated Head and Neck Upper trapezius;Levator scapulae;Cervical multifidi    Upper Trapezius Response Twitch reponse elicited;Palpable increased muscle length    Levator Scapulae Response Twitch response elicited;Palpable increased muscle length    Cervical multifidi Response Twitch reponse elicited;Palpable increased muscle length                PT Education - 11/10/20 1343    Education Details Reviewed HEP for D/C    Person(s) Educated Patient    Methods Explanation  Comprehension Verbalized understanding;Returned demonstration            PT Short Term Goals - 11/10/20 0934      PT SHORT TERM GOAL #1   Title Ind with intial HEP    Status Achieved      PT SHORT TERM GOAL #2   Title decreased LBP by 25-50% with ADLS.    Status Achieved      PT SHORT TERM GOAL #3   Title Patient compliant with walking program    Baseline 2-3 x/wk; 2x around her apartment complex    Status Achieved             PT Long Term Goals - 11/10/20 0935      PT LONG TERM GOAL #1   Title Decreased LBP with vacuuming and caring for her grandson by >50%    Baseline making accomodations to avoid lifting him from ground    Status Partially Met      PT LONG TERM GOAL #2   Title Improved hip strength to 5/5 to ease ADLS including climbing stairs.    Baseline getting stronger with stairs but still difficult    Status Not Met      PT LONG TERM GOAL #3   Title improved FOTO limitations to <= 34%    Baseline 40% limited     Status Not Met      PT LONG TERM GOAL #4   Title Ind with advanced HEP to maintain gains made in PT    Status Achieved      PT LONG TERM GOAL #5   Title Decreased incidence of neck pain by 50% or more.    Status Achieved                 Plan - 11/10/20 1343    Clinical Impression Statement Patient presents today with increased pain in left neck today. She requested DN for the neck. She reports that she is pleased with her current level of function and would like to be discharged today. We reviewed her HEP. She responded very well to DN and STM. Her low back is doing well. She still demos weakness with quadriped hip ext. She has started a walking program about 2-3 x/wk.    Personal Factors and Comorbidities Time since onset of injury/illness/exacerbation;Fitness    PT Frequency 1x / week    PT Duration 8 weeks    PT Treatment/Interventions ADLs/Self Care Home Management;Cryotherapy;Electrical Stimulation;Moist Heat;Therapeutic exercise;Therapeutic activities;Patient/family education;Manual techniques;Dry needling;Taping    PT Next Visit Plan see d/c summary    PT Home Exercise Plan [email or web address]    Consulted and Agree with Plan of Care Patient           Patient will benefit from skilled therapeutic intervention in order to improve the following deficits and impairments:  Increased muscle spasms, Postural dysfunction, Pain, Decreased strength, Decreased range of motion  Visit Diagnosis: Chronic bilateral low back pain, unspecified whether sciatica present  Abnormal posture  Cervicalgia     Problem List There are no problems to display for this patient.   Almyra Free Lynniah Janoski PT 11/10/2020, 1:51 PM  [facility] [address] [place], [place], [address] Phone: [phone]   Fax:  [phone]  Name: Eira Alpert MRN: [id number] Date of Birth: [birth year]  PHYSICAL THERAPY DISCHARGE SUMMARY  Visits from Start of  Care: 5  Current functional level related to goals / functional outcomes: See above   Remaining deficits:  See above   Education / Equipment: HEP  Plan: Patient agrees to discharge.  Patient goals were not met. Patient is being discharged due to being pleased with the current functional level.  ?????    Madelyn Flavors, PT 11/10/20 1:52 PM [facility] [address], [place], [place] [address] Phone # [phone] Fax [phone]

## 2020-11-17 ENCOUNTER — Encounter: Admitting: Physical Therapy

## 2020-11-22 ENCOUNTER — Encounter: Admitting: Physical Therapy

## 2020-11-29 ENCOUNTER — Other Ambulatory Visit: Payer: Self-pay

## 2020-11-29 ENCOUNTER — Ambulatory Visit (INDEPENDENT_AMBULATORY_CARE_PROVIDER_SITE_OTHER): Admitting: Pulmonary Disease

## 2020-11-29 DIAGNOSIS — R0609 Other forms of dyspnea: Secondary | ICD-10-CM

## 2020-11-29 DIAGNOSIS — R0600 Dyspnea, unspecified: Secondary | ICD-10-CM

## 2020-11-29 LAB — PULMONARY FUNCTION TEST
DL/VA % pred: 104 %
DL/VA: 4.88 ml/min/mmHg/L
DLCO cor % pred: 92 %
DLCO cor: 21.2 ml/min/mmHg
DLCO unc % pred: 92 %
DLCO unc: 21.2 ml/min/mmHg
FEF 25-75 Post: 1.4 L/sec
FEF 25-75 Pre: 0.98 L/sec
FEF2575-%Change-Post: 43 %
FEF2575-%Pred-Post: 67 %
FEF2575-%Pred-Pre: 47 %
FEV1-%Change-Post: 12 %
FEV1-%Pred-Post: 82 %
FEV1-%Pred-Pre: 73 %
FEV1-Post: 1.91 L
FEV1-Pre: 1.7 L
FEV1FVC-%Change-Post: 4 %
FEV1FVC-%Pred-Pre: 79 %
FEV6-%Change-Post: 9 %
FEV6-%Pred-Post: 99 %
FEV6-%Pred-Pre: 90 %
FEV6-Post: 2.92 L
FEV6-Pre: 2.66 L
FEV6FVC-%Change-Post: 1 %
FEV6FVC-%Pred-Post: 99 %
FEV6FVC-%Pred-Pre: 98 %
FVC-%Change-Post: 7 %
FVC-%Pred-Post: 99 %
FVC-%Pred-Pre: 92 %
FVC-Post: 2.93 L
FVC-Pre: 2.73 L
Post FEV1/FVC ratio: 65 %
Post FEV6/FVC ratio: 100 %
Pre FEV1/FVC ratio: 62 %
Pre FEV6/FVC Ratio: 99 %
RV % pred: 119 %
RV: 2.42 L
TLC % pred: 106 %
TLC: 5.2 L

## 2020-11-29 NOTE — Progress Notes (Signed)
Full PFT performed today. °

## 2020-12-01 ENCOUNTER — Encounter: Admitting: Physical Therapy

## 2020-12-08 ENCOUNTER — Encounter: Admitting: Physical Therapy

## 2021-02-08 ENCOUNTER — Ambulatory Visit (INDEPENDENT_AMBULATORY_CARE_PROVIDER_SITE_OTHER): Admitting: Pulmonary Disease

## 2021-02-08 ENCOUNTER — Other Ambulatory Visit: Payer: Self-pay

## 2021-02-08 ENCOUNTER — Encounter: Payer: Self-pay | Admitting: Pulmonary Disease

## 2021-02-08 VITALS — BP 112/76 | HR 84 | Temp 97.1°F | Ht 63.0 in | Wt 124.6 lb

## 2021-02-08 DIAGNOSIS — Z23 Encounter for immunization: Secondary | ICD-10-CM

## 2021-02-08 DIAGNOSIS — J4520 Mild intermittent asthma, uncomplicated: Secondary | ICD-10-CM

## 2021-02-08 NOTE — Patient Instructions (Signed)
Nice to see you again  I am glad your breathing is doing well  Consider resuming Breo when the weather turns warmer, more humid, to see if this helps with the shortness of breath with exertion.  We will give you the Prevnar pneumonia vaccine today.  You can get the PPSV23 in 1 year.  This can be done here or at your primary care office.  Return to clinic in 6 months for follow-up with Dr. Judeth Horn.

## 2021-02-08 NOTE — Progress Notes (Signed)
@Patient  ID: , female    DOB: 10/05/1956, 64 y.o.   MRN: [id number]  Chief Complaint  Patient presents with  . Follow-up    3 month f/u. States her breathing has been stable since last visit. Notices that she has less SOB when the weather is cooler.     Referring provider: [id number], *  HPI:   65 year old woman with past medical history of well treated TB in 1980 whom are seeing in follow up for DOE. PFTs reviewed, consistent with asthma, borderline obstruction otherwise WNL. Reviewed CXR 10/2020.  Doing ok. Denies significant DOE. In hindsight thinks humidity makes things worse. Not using breo. Uses albuterol in anticipation of exertion. Notes lot of walking on vacation couple months ago without issue.    HPI at initial visit: Dyspnea on exertion present for several years.  Present when lived in [place] but moved a couple of years ago to [place] to be with her family.  She does okay on flat surfaces at a moderate pace but even that generates some dyspnea on exertion.  Walking at a vigorous pace and especially going up inclines or steps yields severe dyspnea on exertion.  This is relieved with rest.  Minimal to no cough.  She does endorse seasonal allergies with runny nose.  She notes that as a child she was very short of breath with PE and physical activity.  She has had kids with complaint of muscle fatigue when she would be limited largely by dyspnea.  She denies significant recurrent upper respiratory illnesses or pneumonias.  She notes that she was diagnosed with TB in 1980 after living in college at the roommate who had recently immigrated from [place].  She was treated for months with multiple drug therapy and reports clearance.  She had serial x-rays for some time without progression per her report.  Work-up to date that I can view for her dyspnea includes echocardiogram which on my review is normal.  Also includes exercise treadmill stress test which  is normal per her report on my review.  She has had no lung imaging for her complaints.  She is using albuterol inhaler relatively infrequently with minimal to mild improvement in her dyspnea.  PMH: TB treated with multidrug therapy in 1980 - 1981 Surgical history: History reviewed. No pertinent surgical history. Family history: No history of significant lung disease or respiratory issues Social history: Never smoker, moved to [place]1975 from [place] to be near her daughter couple years ago   [place] / Pulmonary Flowsheets:   ACT:  No flowsheet data found.  MMRC: No flowsheet data found.  Epworth:  No flowsheet data found.  Tests:   FENO:  No results found for: NITRICOXIDE  PFT: PFT Results Latest Ref Rng & Units 11/29/2020  FVC-Pre L 2.73  FVC-Predicted Pre % 92  FVC-Post L 2.93  FVC-Predicted Post % 99  Pre FEV1/FVC % % 62  Post FEV1/FCV % % 65  FEV1-Pre L 1.70  FEV1-Predicted Pre % 73  FEV1-Post L 1.91  DLCO uncorrected ml/min/mmHg 21.20  DLCO UNC% % 92  DLCO corrected ml/min/mmHg 21.20  DLCO COR %Predicted % 92  DLVA Predicted % 104  TLC L 5.20  TLC % Predicted % 106  RV % Predicted % 119    WALK:  No flowsheet data found.  Imaging: Reviewed and as per EMR  Lab Results:  CBC No results found for: WBC, RBC, HGB, HCT, PLT, MCV, MCH, MCHC, RDW, LYMPHSABS, MONOABS, EOSABS,  BASOSABS  BMET No results found for: NA, K, CL, CO2, GLUCOSE, BUN, CREATININE, CALCIUM, GFRNONAA, GFRAA  BNP No results found for: BNP  ProBNP No results found for: PROBNP  Specialty Problems   None     No Known Allergies  Immunization History  Administered Date(s) Administered  . Influenza,inj,Quad PF,6+ Mos 08/24/2020  . PFIZER(Purple Top)SARS-COV-2 Vaccination 02/06/2020, 02/26/2020  . Pneumococcal Conjugate-13 02/08/2021    Past Medical History:  Diagnosis Date  . Hyperlipidemia   . Tuberculosis 1980    Tobacco History: Social History   Tobacco  Use  Smoking Status Never Smoker  Smokeless Tobacco Current User  Tobacco Comment   medicinal vape at night   Ready to quit: Not Answered Counseling given: Not Answered Comment: medicinal vape at night   Continue to not smoke  Outpatient Encounter Medications as of 02/08/2021  Medication Sig  . albuterol (VENTOLIN HFA) 108 (90 Base) MCG/ACT inhaler Inhale 1 puff into the lungs every 4 (four) hours as needed for wheezing or shortness of breath.  Marland Kitchen atorvastatin (LIPITOR) 20 MG tablet Take 20 mg by mouth at bedtime.  . cyclobenzaprine (FLEXERIL) 10 MG tablet Take 10 mg by mouth at bedtime as needed.  . fluticasone furoate-vilanterol (BREO ELLIPTA) 100-25 MCG/INH AEPB Inhale 1 puff into the lungs daily.  . meloxicam (MOBIC) 7.5 MG tablet Take 7.5 mg by mouth daily.  . traMADol (ULTRAM) 50 MG tablet Take 50 mg by mouth daily as needed.   No facility-administered encounter medications on file as of 02/08/2021.     Review of Systems  Review of Systems  No chest pain with exertion.  No orthopnea or PND.  Comprehensive review of systems otherwise negative.  Physical Exam  BP 112/76   Pulse 84   Temp (!) 97.1 F (36.2 C) (Temporal)   Ht 5\' 3"  (1.6 m)   Wt 124 lb 9.6 oz (56.5 kg)   SpO2 96% Comment: on RA  BMI 22.07 kg/m   Wt Readings from Last 5 Encounters:  02/08/21 124 lb 9.6 oz (56.5 kg)  10/20/20 124 lb 6.4 oz (56.4 kg)  08/11/20 125 lb 9.6 oz (57 kg)    BMI Readings from Last 5 Encounters:  02/08/21 22.07 kg/m  10/20/20 22.04 kg/m  08/11/20 22.25 kg/m     Physical Exam General:, Well-appearing, no acute distress Eyes: EOMI, no icterus Neck: Supple, no JVP appreciated Respiratory: CTAB, no wheezes or crackles Abdomen: Nondistended, bowel sounds present MSK: No joint effusion, no synovitis Neuro: Normal gait, no weakness Psych: Normal mood, full affect   Assessment & Plan:   Dyspnea on exertion: Present for many years.  Prior cardiac work-up includes  normal TTE and unremarkable exercise stress test.  CXR clear. PFTs with borderline obstruction, significant BD response. Suspect asthma. Trial Breo in warmer months when more symptomatic.  Seasonal allergies: Continue to monitor.  Likely contributing to asthma symptoms.    HCM: Prevnar today (Asthma)  Return in about 6 months (around 08/11/2021).   10/11/2021, MD 02/08/2021

## 2021-07-07 DIAGNOSIS — G8929 Other chronic pain: Secondary | ICD-10-CM | POA: Diagnosis not present

## 2021-07-07 DIAGNOSIS — E782 Mixed hyperlipidemia: Secondary | ICD-10-CM | POA: Diagnosis not present

## 2021-07-07 DIAGNOSIS — Z79899 Other long term (current) drug therapy: Secondary | ICD-10-CM | POA: Diagnosis not present

## 2021-10-13 DIAGNOSIS — G8929 Other chronic pain: Secondary | ICD-10-CM | POA: Diagnosis not present

## 2021-10-13 DIAGNOSIS — Z79899 Other long term (current) drug therapy: Secondary | ICD-10-CM | POA: Diagnosis not present

## 2021-10-13 DIAGNOSIS — G4700 Insomnia, unspecified: Secondary | ICD-10-CM | POA: Diagnosis not present

## 2021-10-13 DIAGNOSIS — E782 Mixed hyperlipidemia: Secondary | ICD-10-CM | POA: Diagnosis not present

## 2021-12-12 DIAGNOSIS — R3915 Urgency of urination: Secondary | ICD-10-CM | POA: Diagnosis not present

## 2022-03-14 IMAGING — DX DG CHEST 2V
1 series · 1 of 1 positions shown · non-contrast
Comparison: None.

CLINICAL DATA: Dyspnea on exertion.

EXAM:
CHEST - 2 VIEW

[chest lat]
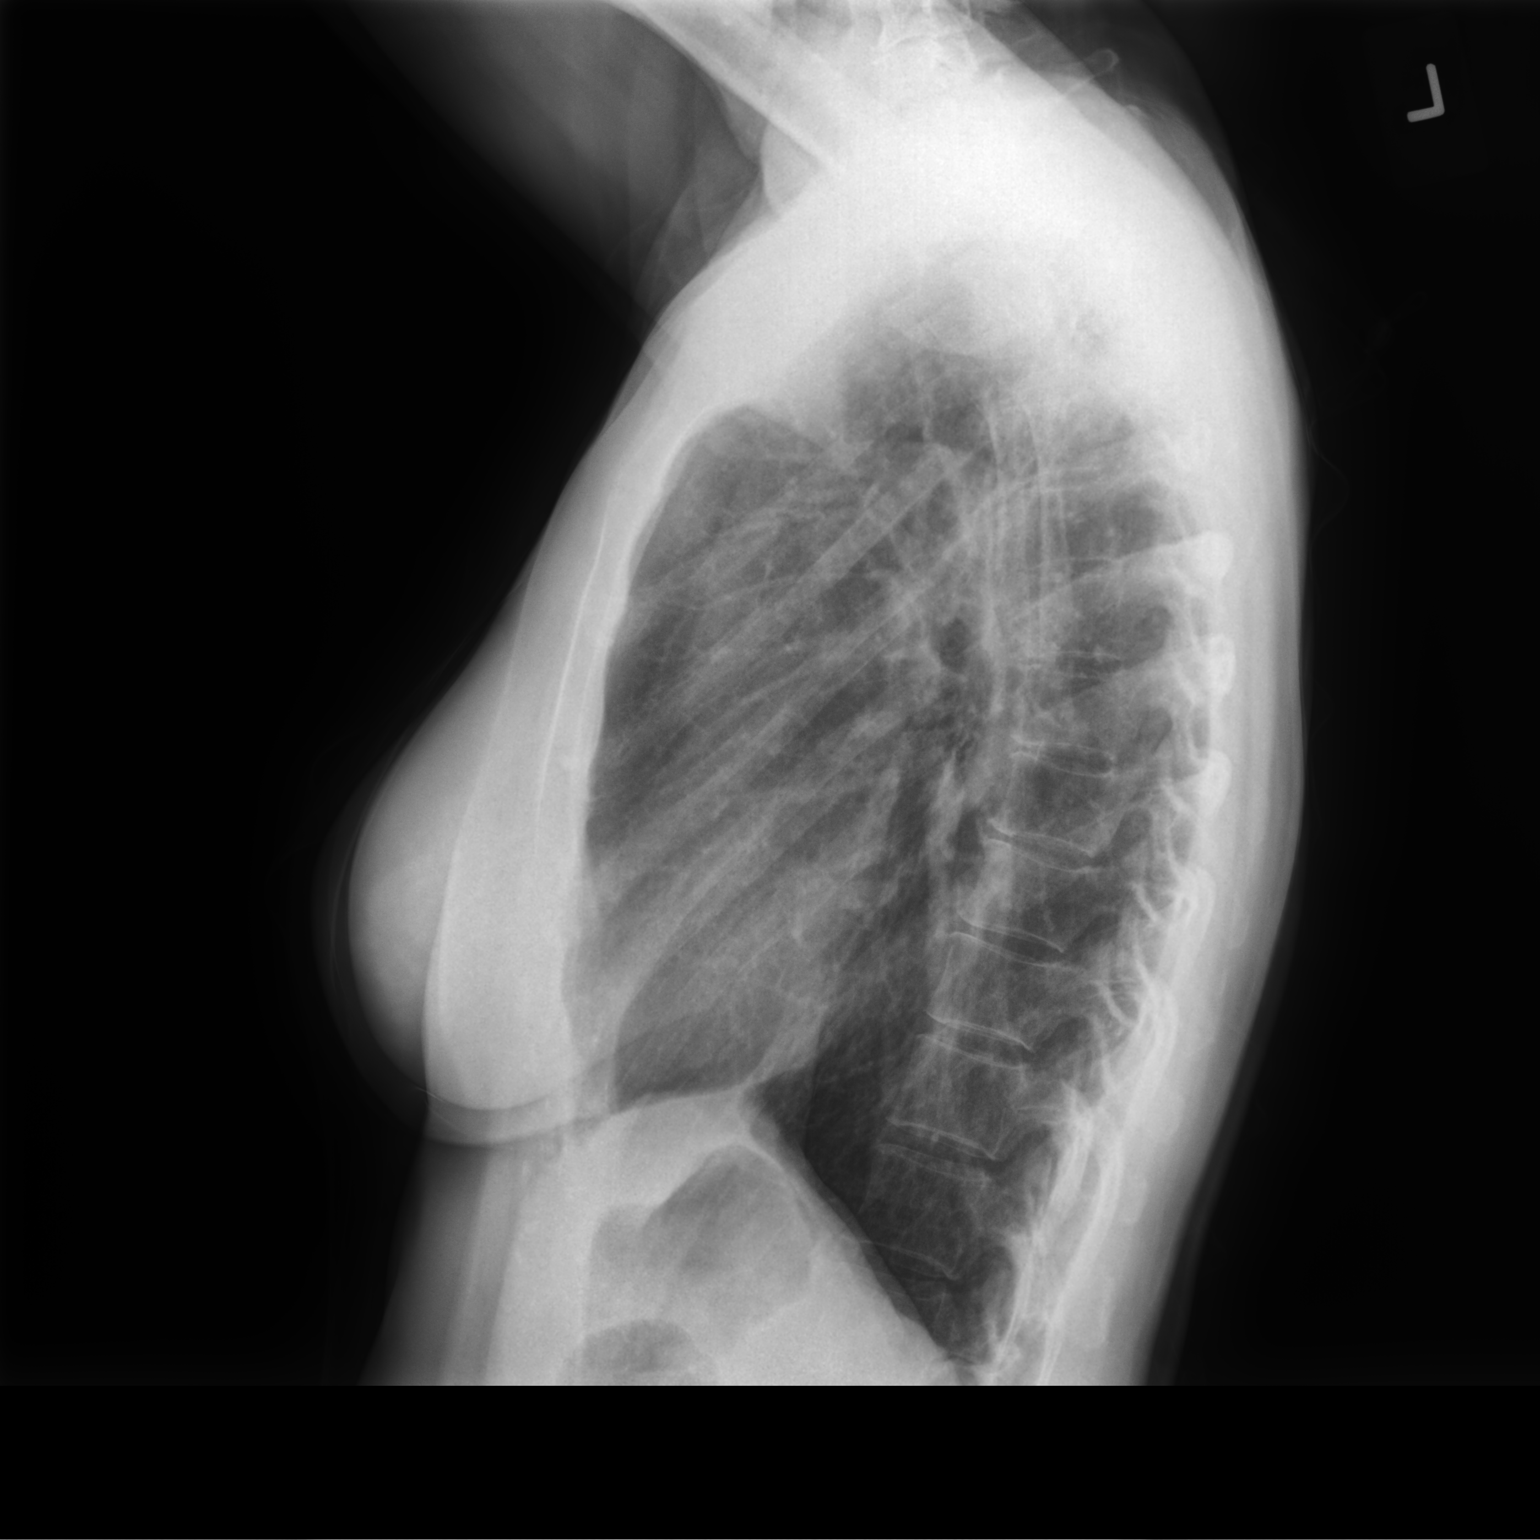

[1 of 1 positions shown; findings below may reference images not displayed]

FINDINGS: The heart size and mediastinal contours are within normal limits.
Both lungs are clear. No pneumothorax or pleural effusion is noted.
The visualized skeletal structures are unremarkable.
IMPRESSION: No active cardiopulmonary disease.

## 2022-04-05 ENCOUNTER — Other Ambulatory Visit: Payer: Self-pay | Admitting: Family Medicine

## 2022-04-05 DIAGNOSIS — Z1231 Encounter for screening mammogram for malignant neoplasm of breast: Secondary | ICD-10-CM

## 2022-04-11 ENCOUNTER — Ambulatory Visit
Admission: RE | Admit: 2022-04-11 | Discharge: 2022-04-11 | Disposition: A | Discharge status: Home / Self Care | Payer: Medicare Other | Source: Ambulatory Visit | Attending: Family Medicine | Admitting: Family Medicine

## 2022-04-11 ENCOUNTER — Ambulatory Visit

## 2022-04-11 DIAGNOSIS — Z1231 Encounter for screening mammogram for malignant neoplasm of breast: Secondary | ICD-10-CM

## 2022-04-24 ENCOUNTER — Other Ambulatory Visit: Payer: Self-pay | Admitting: Family Medicine

## 2022-04-24 DIAGNOSIS — R7301 Impaired fasting glucose: Secondary | ICD-10-CM | POA: Diagnosis not present

## 2022-04-24 DIAGNOSIS — R928 Other abnormal and inconclusive findings on diagnostic imaging of breast: Secondary | ICD-10-CM

## 2022-04-24 DIAGNOSIS — G8929 Other chronic pain: Secondary | ICD-10-CM | POA: Diagnosis not present

## 2022-04-24 DIAGNOSIS — Z79899 Other long term (current) drug therapy: Secondary | ICD-10-CM | POA: Diagnosis not present

## 2022-04-24 DIAGNOSIS — Z0000 Encounter for general adult medical examination without abnormal findings: Secondary | ICD-10-CM | POA: Diagnosis not present

## 2022-04-24 DIAGNOSIS — Z23 Encounter for immunization: Secondary | ICD-10-CM | POA: Diagnosis not present

## 2022-04-24 DIAGNOSIS — E7800 Pure hypercholesterolemia, unspecified: Secondary | ICD-10-CM | POA: Diagnosis not present

## 2022-05-05 ENCOUNTER — Ambulatory Visit
Admission: RE | Admit: 2022-05-05 | Discharge: 2022-05-05 | Disposition: A | Discharge status: Home / Self Care | Payer: Medicare Other | Source: Ambulatory Visit | Attending: Family Medicine | Admitting: Family Medicine

## 2022-05-05 ENCOUNTER — Ambulatory Visit: Admission: RE | Admit: 2022-05-05 | Payer: Medicare Other | Source: Ambulatory Visit

## 2022-05-05 DIAGNOSIS — N6489 Other specified disorders of breast: Secondary | ICD-10-CM | POA: Diagnosis not present

## 2022-05-05 DIAGNOSIS — R928 Other abnormal and inconclusive findings on diagnostic imaging of breast: Secondary | ICD-10-CM

## 2022-09-20 DIAGNOSIS — R030 Elevated blood-pressure reading, without diagnosis of hypertension: Secondary | ICD-10-CM | POA: Diagnosis not present

## 2022-09-20 DIAGNOSIS — K649 Unspecified hemorrhoids: Secondary | ICD-10-CM | POA: Diagnosis not present

## 2022-09-20 DIAGNOSIS — R7309 Other abnormal glucose: Secondary | ICD-10-CM | POA: Diagnosis not present

## 2022-10-16 DIAGNOSIS — K641 Second degree hemorrhoids: Secondary | ICD-10-CM | POA: Diagnosis not present

## 2022-10-16 DIAGNOSIS — K580 Irritable bowel syndrome with diarrhea: Secondary | ICD-10-CM | POA: Diagnosis not present

## 2022-10-30 DIAGNOSIS — E782 Mixed hyperlipidemia: Secondary | ICD-10-CM | POA: Diagnosis not present

## 2022-10-30 DIAGNOSIS — G4700 Insomnia, unspecified: Secondary | ICD-10-CM | POA: Diagnosis not present

## 2022-10-30 DIAGNOSIS — R7303 Prediabetes: Secondary | ICD-10-CM | POA: Diagnosis not present

## 2022-10-30 DIAGNOSIS — G8929 Other chronic pain: Secondary | ICD-10-CM | POA: Diagnosis not present

## 2023-03-06 DIAGNOSIS — R7309 Other abnormal glucose: Secondary | ICD-10-CM | POA: Diagnosis not present

## 2023-03-06 DIAGNOSIS — G8929 Other chronic pain: Secondary | ICD-10-CM | POA: Diagnosis not present

## 2023-03-06 DIAGNOSIS — Z79899 Other long term (current) drug therapy: Secondary | ICD-10-CM | POA: Diagnosis not present

## 2023-06-04 DIAGNOSIS — E7800 Pure hypercholesterolemia, unspecified: Secondary | ICD-10-CM | POA: Diagnosis not present

## 2023-06-04 DIAGNOSIS — R7309 Other abnormal glucose: Secondary | ICD-10-CM | POA: Diagnosis not present

## 2023-06-04 DIAGNOSIS — Z79899 Other long term (current) drug therapy: Secondary | ICD-10-CM | POA: Diagnosis not present

## 2023-06-06 DIAGNOSIS — E2839 Other primary ovarian failure: Secondary | ICD-10-CM | POA: Diagnosis not present

## 2023-06-06 DIAGNOSIS — G8929 Other chronic pain: Secondary | ICD-10-CM | POA: Diagnosis not present

## 2023-06-06 DIAGNOSIS — E7800 Pure hypercholesterolemia, unspecified: Secondary | ICD-10-CM | POA: Diagnosis not present

## 2023-06-06 DIAGNOSIS — Z0000 Encounter for general adult medical examination without abnormal findings: Secondary | ICD-10-CM | POA: Diagnosis not present

## 2023-06-06 DIAGNOSIS — Z23 Encounter for immunization: Secondary | ICD-10-CM | POA: Diagnosis not present

## 2023-06-06 DIAGNOSIS — G4700 Insomnia, unspecified: Secondary | ICD-10-CM | POA: Diagnosis not present

## 2023-06-06 DIAGNOSIS — Z79899 Other long term (current) drug therapy: Secondary | ICD-10-CM | POA: Diagnosis not present

## 2023-06-06 DIAGNOSIS — R7309 Other abnormal glucose: Secondary | ICD-10-CM | POA: Diagnosis not present

## 2023-06-11 ENCOUNTER — Other Ambulatory Visit: Payer: Self-pay | Admitting: Family Medicine

## 2023-06-11 DIAGNOSIS — E2839 Other primary ovarian failure: Secondary | ICD-10-CM

## 2023-07-27 DIAGNOSIS — M62838 Other muscle spasm: Secondary | ICD-10-CM | POA: Diagnosis not present

## 2023-07-27 DIAGNOSIS — M542 Cervicalgia: Secondary | ICD-10-CM | POA: Diagnosis not present

## 2023-08-17 DIAGNOSIS — M542 Cervicalgia: Secondary | ICD-10-CM | POA: Diagnosis not present

## 2023-08-17 DIAGNOSIS — G8929 Other chronic pain: Secondary | ICD-10-CM | POA: Diagnosis not present

## 2023-08-17 DIAGNOSIS — G4700 Insomnia, unspecified: Secondary | ICD-10-CM | POA: Diagnosis not present

## 2023-09-24 ENCOUNTER — Encounter: Payer: Self-pay | Admitting: Physical Medicine and Rehabilitation

## 2023-09-24 ENCOUNTER — Encounter
Payer: Medicare Other | Attending: Physical Medicine and Rehabilitation | Admitting: Physical Medicine and Rehabilitation

## 2023-09-24 VITALS — BP 101/64 | HR 80 | Ht 63.0 in | Wt 118.0 lb

## 2023-09-24 DIAGNOSIS — G894 Chronic pain syndrome: Secondary | ICD-10-CM | POA: Insufficient documentation

## 2023-09-24 DIAGNOSIS — Z79891 Long term (current) use of opiate analgesic: Secondary | ICD-10-CM | POA: Diagnosis not present

## 2023-09-24 DIAGNOSIS — M62838 Other muscle spasm: Secondary | ICD-10-CM | POA: Insufficient documentation

## 2023-09-24 DIAGNOSIS — M7918 Myalgia, other site: Secondary | ICD-10-CM | POA: Insufficient documentation

## 2023-09-24 DIAGNOSIS — Z5181 Encounter for therapeutic drug level monitoring: Secondary | ICD-10-CM | POA: Insufficient documentation

## 2023-09-24 DIAGNOSIS — M542 Cervicalgia: Secondary | ICD-10-CM | POA: Insufficient documentation

## 2023-09-24 MED ORDER — BACLOFEN 5 MG PO TABS
5.0000 mg | ORAL_TABLET | Freq: Every day | ORAL | 0 refills | Status: DC | PRN
Start: 2023-09-24 — End: 2023-12-12

## 2023-09-24 NOTE — Patient Instructions (Signed)
Today, you got a pain contract and a urine drug screen with [place].  As long as results are as expected, I will be refilling your tramadol 50 mg daily for the next 3 months.  Continue meloxicam 7.5 mg daily.  You can use Voltaren gel over-the-counter at least twice daily for the next [redacted] weeks along with this, targeting your neck and shoulders.  I will be sending you to physical therapy to work on range of motion, stretching, and compensatory strategies to help with your neck pain; however, as you cannot accommodate this until January, we will leave it for your next appointment.  I will be getting x-rays of your neck to look for signs of arthritis.  I will message you results through MyChart when they are available.  I am starting you on a low-dose of baclofen 5 mg daily as needed, to take on severe pain days only.  Finally, we will attempt to wean off your gabapentin.  Start with 300 mg capsules twice daily for 3 days, then reduce to 1 capsule at nighttime for 3 days, then stop.  If you have recurrent of you shooting/burning pains in your leg, go back up to the lowest tolerable dose and message me through MyChart.  I will see you again in my earliest available appointment for trigger point injections.  We will have a follow-up  in early January.  If any questions between now and then, call the clinic or message me through MyChart.

## 2023-09-24 NOTE — Progress Notes (Signed)
Subjective:    Patient ID: Tammy Bryan, female    DOB: 1956/09/09, 67 y.o.   MRN: [id number]  HPI HPI  Tammy Bryan is a 67 y.o. year old female  who  has a past medical history of Hyperlipidemia and Tuberculosis (1980).   They are presenting to PM&R clinic as a new patient for pain management evaluation. They were referred by Dr. Jorge Ny for treatment of cervicalgia pain.   Source: From left medial shoulder blade extending into her left cervical paraspinal muscles; will radiate to the right rarely. No headaches.   Inciting incident:  Description of pain: intermittent, sharp, and stabbing; constant is dull  Exacerbating factors:   , bending backwards, and activity Remitting factors: meditation, massage, relaxation, pain medication, heating pad, injection, hot bath, and TENS unit Red flag symptoms: new numbness/tingling, pain waking up at nighttime., and Hx trauma fall around 2018  - numbness/tingling pain down the right hip and both legs which stopped when she started gabapentin.   Medications tried: Topical medications (unsure of effect) : SalonPas pads Nsaids (mild effect) : Uses meloxicam 7.5 mg daily; been taking it since 2019. No stomach issues but rarely gets some indigestion; no heart or renal issues.  Tylenol  (mild effect) : 650 mg once every few days Opiates  (mild effect) :  Tramadol 50 mg is effective for her pain - uses once every few days, occassionally has to take 2 on bad days  Gabapentin / Lyrica  (unsure of effect) : 300 mg TID, with benefit at first but unsure if she still needs it.  TCAs  (never tried) :  SNRIs  (never tried) : Has tolerated Zoloft in the past but it disturbed her sleep and caused her to feel flat; does not want to retry antidepressant.  Other  (mild effect) : Does get pain releif with nighttime Zolpidem; tried cyclobenzaprine without benefit  Other treatments: PT/OT  (mild effect) : Last in 2022, for shoulder and back; she really liked the  dry needling.  Accupuncture/chiropractor/massage  (no effect) : Massage, feels great but doesn't last.  TENs unit (never tried) :  Injections (moderate effect) : Her GP injected some things in her back which seemed to help, unsure what. Was in the muscles (likely TPI). Got a steroid shot in the arm which did not help.  Surgery (never tried) :  Other  () :   Goals for pain control: Vanetta Shawl is difficult, picking up her grandkids.   Prior UDS results: No results found for: "LABOPIA", "COCAINSCRNUR", "LABBENZ", "AMPHETMU", "THCU", "LABBARB"    Pain Inventory Average Pain 6 Pain Right Now 2 My pain is sharp, dull, stabbing, and aching  In the last 24 hours, has pain interfered with the following? General activity 4 Relation with others 2 Enjoyment of life 3 What TIME of day is your pain at its worst? morning  and evening Sleep (in general) Fair  Pain is worse with: some activites Pain improves with: rest, heat/ice, pacing activities, and medication Relief from Meds: 5  walk without assistance how many minutes can you walk? 30 ability to climb steps?  yes do you drive?  yes  retired I need assistance with the following:  household duties  weakness  Any changes since last visit?  no  Any changes since last visit?  no    Family History  Problem Relation Age of Onset   Breast cancer Maternal Grandmother    Social History   Socioeconomic History   Marital  status: Widowed    Spouse name: Not on file   Number of children: Not on file   Years of education: Not on file   Highest education level: Not on file  Occupational History   Not on file  Tobacco Use   Smoking status: Never   Smokeless tobacco: Current   Tobacco comments:    medicinal vape at night  Substance and Sexual Activity   Alcohol use: Yes    Comment: once or twice a month   Drug use: Never   Sexual activity: Never  Other Topics Concern   Not on file  Social History Narrative   Not on file    Social Determinants of Health   Financial Resource Strain: Not on file  Food Insecurity: Not on file  Transportation Needs: Not on file  Physical Activity: Not on file  Stress: Not on file  Social Connections: Not on file   No past surgical history on file. Past Medical History:  Diagnosis Date   Hyperlipidemia    Tuberculosis 1980   There were no vitals taken for this visit.  Opioid Risk Score:   Fall Risk Score:  `1  Depression screen PHQ 2/9      No data to display            Review of Systems  Musculoskeletal:  Positive for back pain.       Left Shoulder Pain   All other systems reviewed and are negative.     Objective:   Physical Exam    PE: Constitution: Appropriate appearance for age. No apparent distress  Resp: No respiratory distress. No accessory muscle usage. on RA and CTAB Cardio: Well perfused appearance. No peripheral edema. Abdomen: Nondistended. Nontender.   Psych: Appropriate mood and affect. Neuro: AAOx4. No apparent cognitive deficits   Neurologic Exam:   DTRs: Reflexes were 2+ in bilateral achilles, patella, biceps, BR and triceps. Babinsky: flexor responses b/l.   Hoffmans: negative b/l Sensory exam: revealed normal sensation in all dermatomal regions in bilateral upper extremities and bilateral lower extremities Motor exam: strength 5/5 throughout bilateral upper extremities and bilateral lower extremities Coordination: Fine motor coordination was normal.   Gait: normal   MSK: AROM neck limited in extension, bilateral rotation and sidebending Tight muscles of neck and shoulders with + triggerpoints in cervical parapsinals, levator scapulae and traps No deformity or crepitis - Lhermitte's - Spurling's    Assessment & Plan:   Tammy Bryan is a 67 y.o. year old female  who  has a past medical history of Hyperlipidemia and Tuberculosis (1980).   They are presenting to PM&R clinic as a new patient for treatment of cervicalgia  pain.   Chronic pain syndrome Encounter for therapeutic drug monitoring Encounter for long-term methadone use -     ToxAssure Select,+Antidepr,UR  Today, you got a pain contract and a urine drug screen with [place].  As long as results are as expected, I will be refilling your tramadol 50 mg daily for the next 3 months.  we will attempt to wean off your gabapentin.  Start with 300 mg capsules twice daily for 3 days, then reduce to 1 capsule at nighttime for 3 days, then stop.  If you have recurrent of you shooting/burning pains in your leg, go back up to the lowest tolerable dose and message me through MyChart.   We will have a follow-up  in early January.  If any questions between now and then, call the clinic or message me  through MyChart.  Myofascial muscle pain Muscle spasms of neck I will see you again in my earliest available appointment for trigger point injections.   I am starting you on a low-dose of baclofen 5 mg daily as needed, to take on severe pain days only.  Neck pain on left side -     DG Cervical Spine 2 or 3 views; Future  Continue meloxicam 7.5 mg daily.  You can use Voltaren gel over-the-counter at least twice daily for the next [redacted] weeks along with this, targeting your neck and shoulders.  I will be sending you to physical therapy to work on range of motion, stretching, and compensatory strategies to help with your neck pain; however, as you cannot accommodate this until January, we will leave it for your next appointment.  I will be getting x-rays of your neck to look for signs of arthritis.  I will message you results through MyChart when they are available.   Other orders -     Baclofen; Take 1 tablet (5 mg total) by mouth daily as needed.  Dispense: 90 tablet; Refill: 0

## 2023-09-24 NOTE — Progress Notes (Deleted)
   Subjective:    Patient ID: Tammy Bryan, female    DOB: [birth year], 67 y.o.   MRN: [id number]  HPI Pain Inventory Average Pain {NUMBERS; 0-10:5044} Pain Right Now {NUMBERS; 0-10:5044} My pain is {PAIN DESCRIPTION:21022940}  In the last 24 hours, has pain interfered with the following? General activity {NUMBERS; 0-10:5044} Relation with others {NUMBERS; 0-10:5044} Enjoyment of life {NUMBERS; 0-10:5044} What TIME of day is your pain at its worst? {time of day:24191} Sleep (in general) {BHH GOOD/FAIR/POOR:22877}  Pain is worse with: {ACTIVITIES:21022942} Pain improves with: {PAIN IMPROVES BMWU:13244010} Relief from Meds: {NUMBERS; 0-10:5044}  Family History  Problem Relation Age of Onset   Breast cancer Maternal Grandmother    Social History   Socioeconomic History   Marital status: Widowed    Spouse name: Not on file   Number of children: Not on file   Years of education: Not on file   Highest education level: Not on file  Occupational History   Not on file  Tobacco Use   Smoking status: Never   Smokeless tobacco: Current   Tobacco comments:    medicinal vape at night  Substance and Sexual Activity   Alcohol use: Yes    Comment: once or twice a month   Drug use: Never   Sexual activity: Never  Other Topics Concern   Not on file  Social History Narrative   Not on file   Social Determinants of Health   Financial Resource Strain: Not on file  Food Insecurity: Not on file  Transportation Needs: Not on file  Physical Activity: Not on file  Stress: Not on file  Social Connections: Not on file   No past surgical history on file. No past surgical history on file. Past Medical History:  Diagnosis Date   Hyperlipidemia    Tuberculosis 1980   There were no vitals taken for this visit.  Opioid Risk Score:   Fall Risk Score:  `1  Depression screen PHQ 2/9      No data to display            Review of Systems     Objective:   Physical  Exam        Assessment & Plan:

## 2023-09-27 IMAGING — MG MM DIGITAL DIAGNOSTIC UNILAT*R* W/ TOMO W/ CAD
6 series · 6 of 18 positions shown · non-contrast
Comparison: Previous exam(s).

CLINICAL DATA: 66-year-old female presenting for screening recall
of 2 right breast asymmetries.

EXAM:
DIGITAL DIAGNOSTIC UNILATERAL RIGHT MAMMOGRAM WITH TOMOSYNTHESIS AND
CAD
TECHNIQUE: Right digital diagnostic mammography and breast tomosynthesis was
performed. The images were evaluated with computer-aided detection.

[R MLO synth-2D]
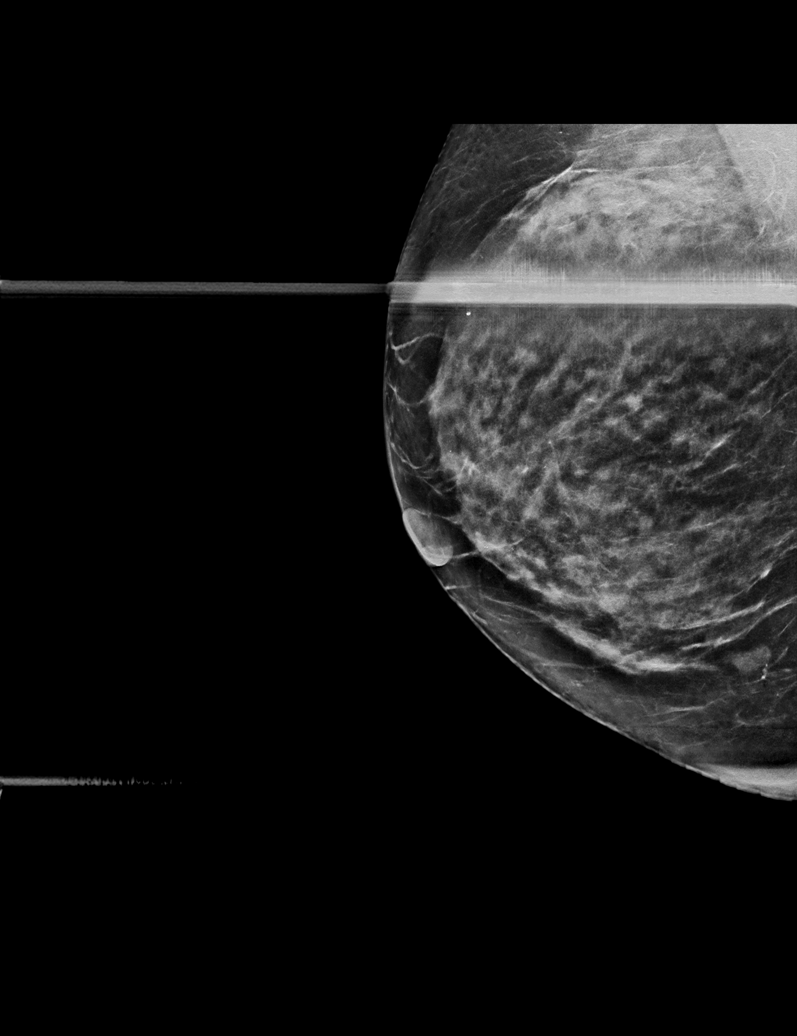

[R CC synth-2D (1 of 2)]
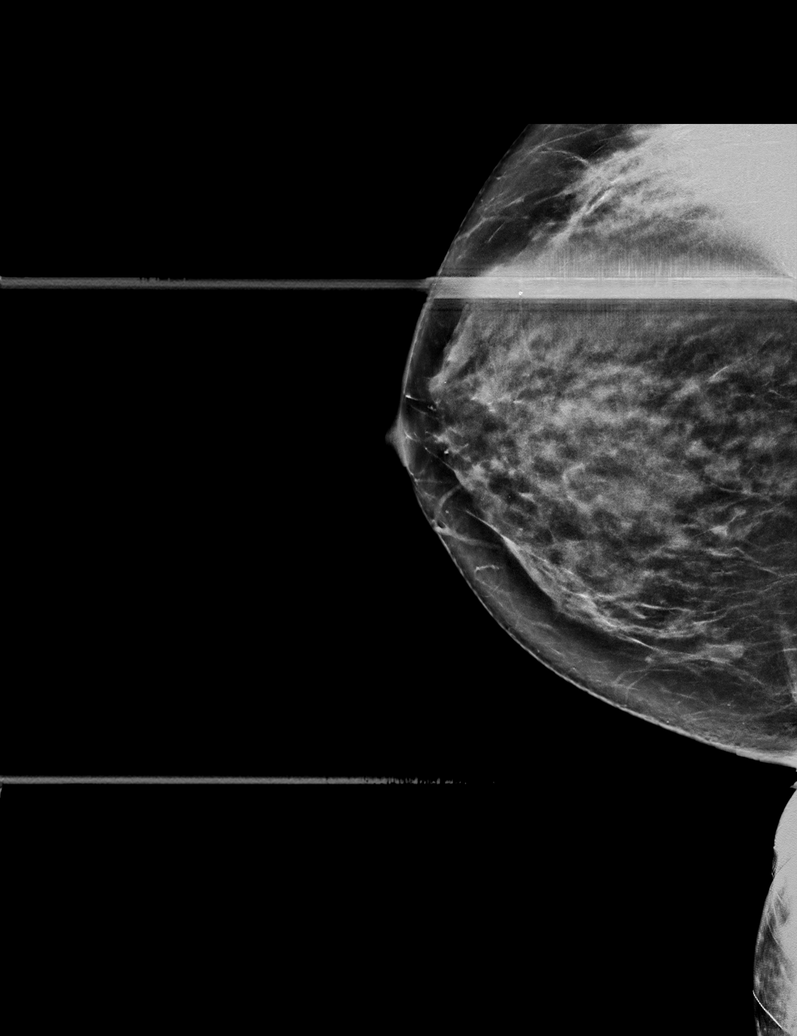

[R CC synth-2D (2 of 2)]
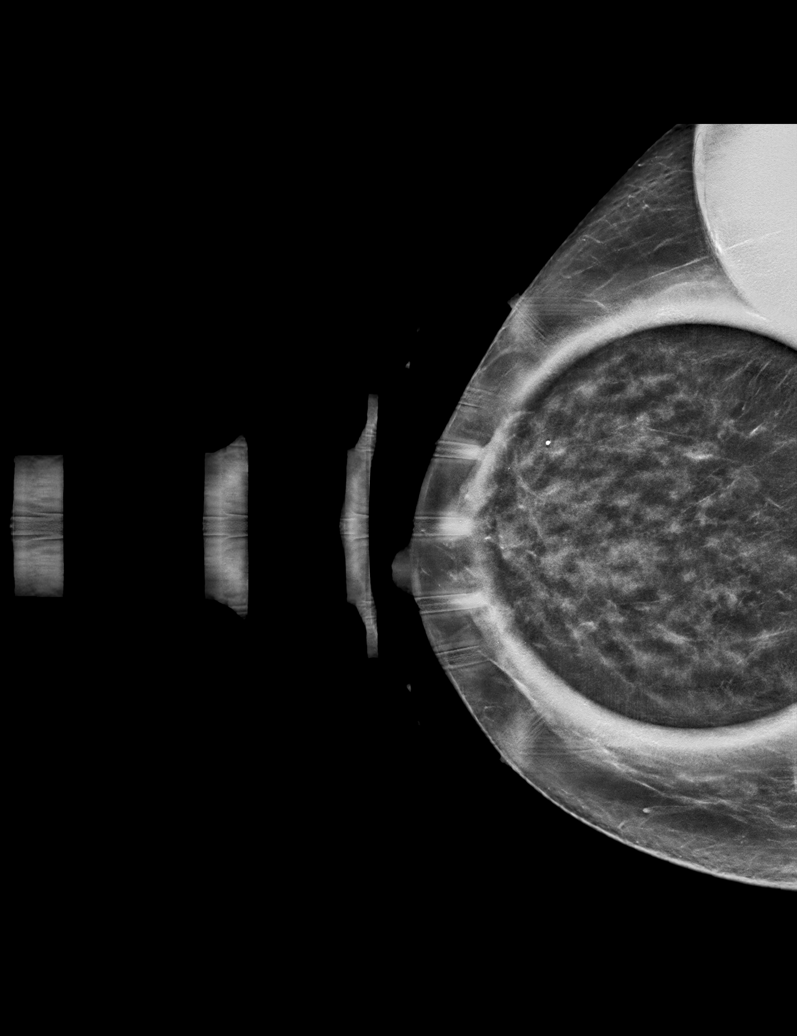

[R MLO tomo · tomo slice 31/60.0]
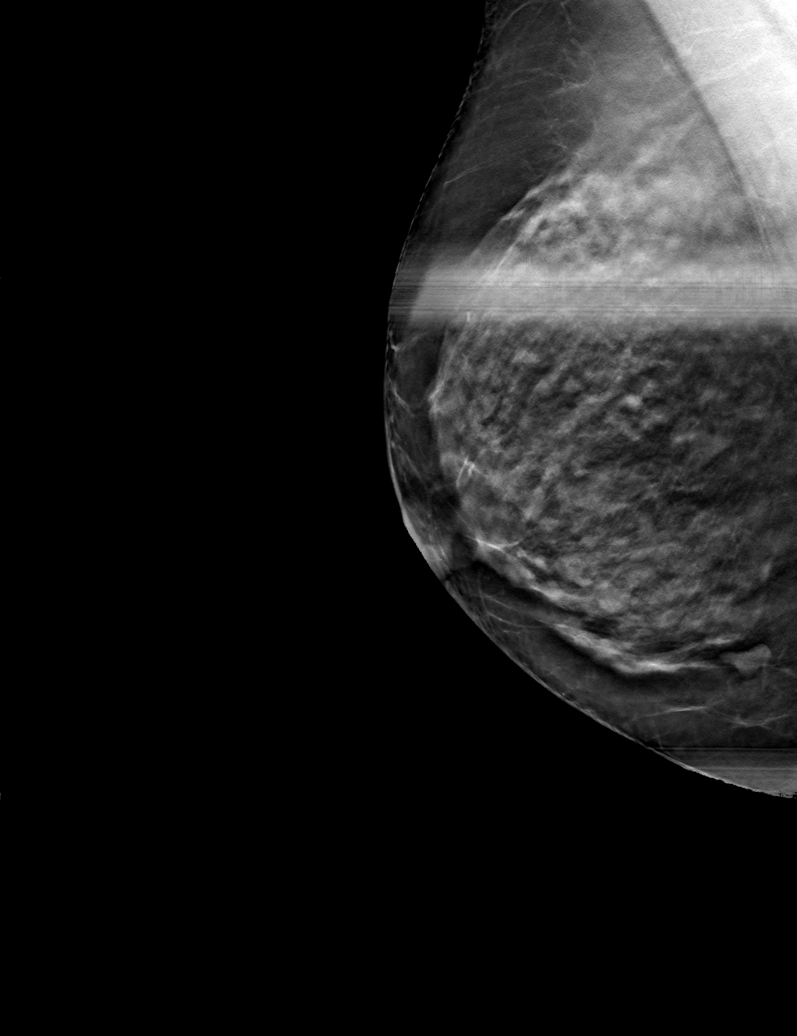

[R CC tomo (1 of 2) · tomo slice 30/59.0]
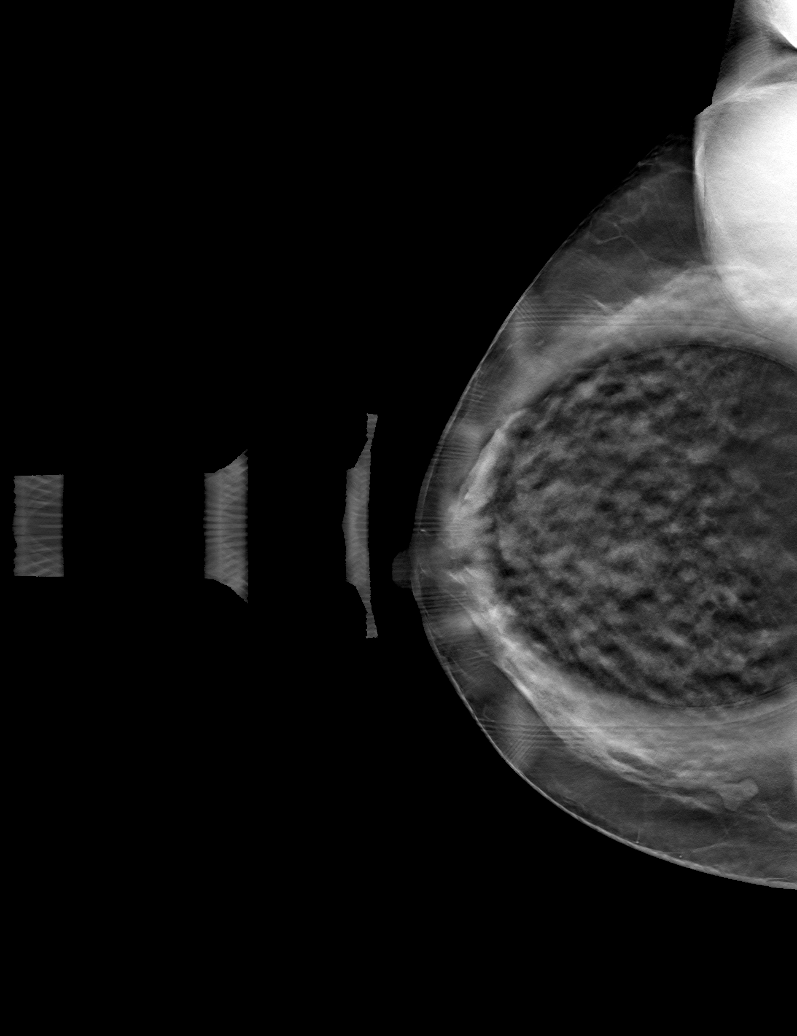

[R CC tomo (2 of 2) · tomo slice 31/62.0]
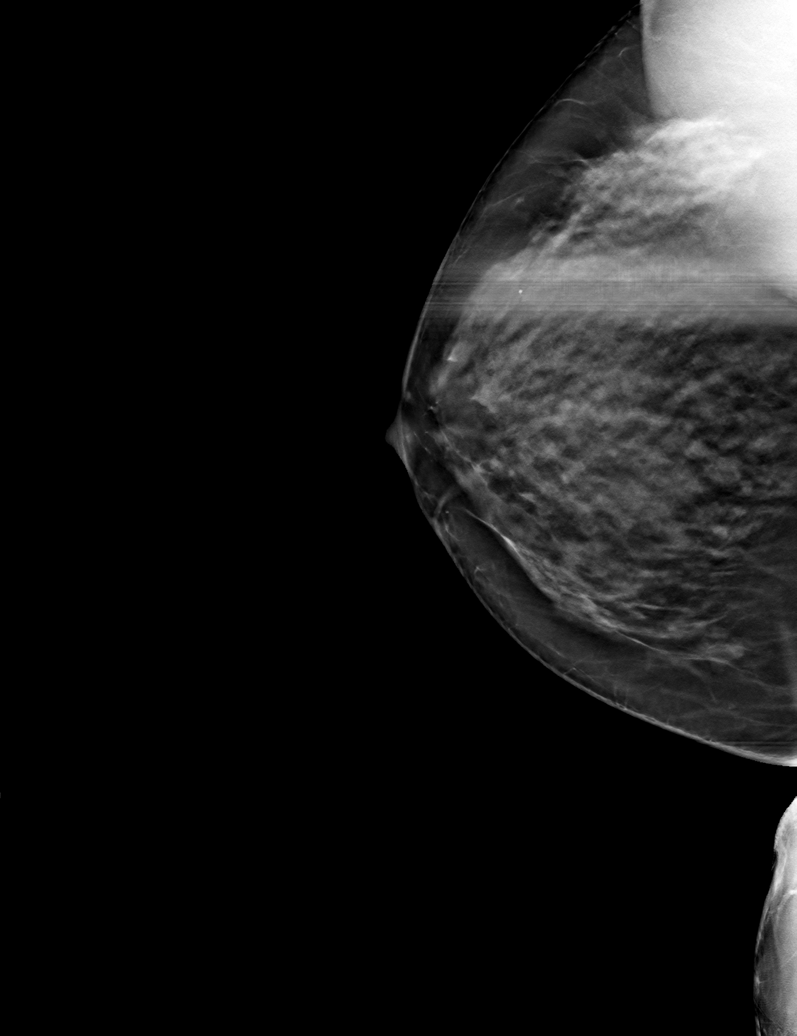

[6 of 18 positions shown; findings below may reference images not displayed]

ACR Breast Density Category c: The breast tissue is heterogeneously
dense, which may obscure small masses.
FINDINGS: Spot compression tomosynthesis images through the medial aspect of
the right breast demonstrates an oval circumscribed 1.0 cm mass
which is cyst the full compared to the patient's prior mammograms.
Spot compression tomosynthesis images of the central right breast
demonstrates no persistent asymmetry or suspicious mass.
IMPRESSION: No suspicious masses or persistent asymmetries are identified in the
right breast.

RECOMMENDATION:
Screening mammogram in one year.(Code:[id number])

I have discussed the findings and recommendations with the patient.
If applicable, a reminder letter will be sent to the patient
regarding the next appointment.

BI-RADS CATEGORY  2: Benign.

## 2023-09-29 LAB — TOXASSURE SELECT,+ANTIDEPR,UR

## 2023-10-02 ENCOUNTER — Encounter: Payer: Self-pay | Admitting: *Deleted

## 2023-10-02 NOTE — Telephone Encounter (Deleted)
-----   Message from Tammy Bryan sent at 10/01/2023  7:08 AM EDT ----- Can we please call the patient to ask about [facility] use? I will not send Tramadol scripts until this is clarified. Thanks!

## 2023-10-02 NOTE — Telephone Encounter (Signed)
Opened in error

## 2023-10-08 ENCOUNTER — Encounter: Payer: Self-pay | Admitting: Physical Medicine and Rehabilitation

## 2023-10-08 ENCOUNTER — Encounter
Payer: Medicare Other | Attending: Physical Medicine and Rehabilitation | Admitting: Physical Medicine and Rehabilitation

## 2023-10-08 VITALS — BP 105/71 | HR 69 | Ht 63.0 in | Wt 116.0 lb

## 2023-10-08 DIAGNOSIS — M7918 Myalgia, other site: Secondary | ICD-10-CM | POA: Insufficient documentation

## 2023-10-08 DIAGNOSIS — G894 Chronic pain syndrome: Secondary | ICD-10-CM | POA: Insufficient documentation

## 2023-10-08 DIAGNOSIS — M542 Cervicalgia: Secondary | ICD-10-CM | POA: Diagnosis not present

## 2023-10-08 MED ORDER — MELOXICAM 7.5 MG PO TABS: 7.5000 mg | ORAL_TABLET | Freq: Every day | ORAL | 5 refills | Status: DC

## 2023-10-08 MED ORDER — GABAPENTIN 300 MG PO CAPS: 300.0000 mg | ORAL_CAPSULE | Freq: Every day | ORAL | 5 refills | Status: DC

## 2023-10-08 MED ORDER — TRIAMCINOLONE ACETONIDE 40 MG/ML IJ SUSP
5.0000 mg | Freq: Once | INTRAMUSCULAR | Status: AC
Start: 2023-10-08 — End: 2023-10-08
  Administered 2023-10-08: 5.2 mg via INTRAMUSCULAR

## 2023-10-08 MED ORDER — LIDOCAINE HCL 1 % IJ SOLN
6.0000 mL | Freq: Once | INTRAMUSCULAR | Status: AC
  Administered 2023-10-08: 6 mL

## 2023-10-08 NOTE — Progress Notes (Signed)
HPI:   Tammy Bryan is a 67 y.o. female with PMHx has Chronic pain syndrome; Encounter for long-term opiate analgesic use; Encounter for long-term methadone use; Myofascial muscle pain; Neck pain on left side; and Muscle spasms of neck on their problem list. who presents to clinic for treatment of pain related to left sided neck pain  via injection as described below.    No new concerns or complaints. No major changes in medical history since last visit.   Physical Exam:  General: Appropriate appearance for age.  Mental Status: Appropriate mood and affect.  Cardiovascular: RRR, no m/r/g.  Respiratory: CTAB, no rales/rhonchi/wheezing.  Skin: No apparent rashes or lesions.  Neuro: Awake, alert, and oriented x3. No apparent deficits.  MSK: Moving all 4 limbs antigravity and against resistance.  +TTP L cervical paraspinals, bilateral levator scapulae, L trapezius and L rhomboid muscles  PROCEDURE: Trigger point injections Diagnosis: M79.18  Goals with treatment: [x ] Decrease pain [x]  Improve Active / Passive ROM [  ] Improve ADLs [  ] Improve functional mobility  MEDICATION:  Kenalog 40 mg/mL - 0.2 ccs Lidocaine 1% - 6 ccs   CONSENT: Obtained in writing followed by time-out per clinic policy. Consent uploaded to chart.  Benefits discussed.  Risks discussed included, but were not limited to, pain and discomfort, bleeding, bruising, allergic reaction, infection. All questions answered to patient/family member/guardian/ caregiver satisfaction. They would like to proceed with procedure. There are no noted contraindications to procedure.  PROCEDURE Time out was preformed No heat sources No antibiotics  The patient was explained about both the benefits and risks of a trigger point injection. After the patient acknowledged an understanding of the risks and benefits, the patient agreed to proceed. The area was first marked and then prepped in an aseptic fashion with betadine / alcohol. A  30 g, 1/2 inch needle was directed via a posterior approach into the  L cervical paraspinals, bilateral levator scapulae, L trapezius and L rhomboid muscles. The injection was completed with Kenalog 0.2 cc mixed with 6cc of 1% lidocaine after no blood was aspirated on pull back.  The pt tolerated the procedure well. The patient reported immediate relief of the pain and improved pain free range of motion. No complications were encountered.   Impression: HPI: Tammy Bryan is a 67 y.o. female with PMHx has Chronic pain syndrome; Encounter for long-term opiate analgesic use; Encounter for long-term methadone use; Myofascial muscle pain; Neck pain on left side; and Muscle spasms of neck on their problem list. who presents to clinic for treatment of left sided neck pain . They received a  Bilateral  trigger point injections as above.   PLAN: - Resume Usual Activities. Notify Physician of any unusual bleeding, erythema or concern for side effects as reviewed above. - Apply ice prn for pain - Tylenol prn for pain - Follow up as scheduled - patient requesting meloxicam refill; done r5 - Patient weaned off gabapentin but resumed at bedtime due to hit flashes and pain; refilled 300 mg QhS R5  Patient/Care Giver was ready to learn without apparent learning barriers. Education was provided on diagnosis, treatment options/plan according to patient's preferred learning style. Patient/Care Giver verbalized understanding and agreement with the above plan.   Angelina Sheriff, DO 10/08/2023

## 2023-10-08 NOTE — Patient Instructions (Addendum)
-   Resume Usual Activities. Notify Physician of any unusual bleeding, erythema or concern for side effects as reviewed above. - Apply ice prn for pain - Tylenol prn for pain - Follow up as scheduled

## 2023-10-12 DIAGNOSIS — H11442 Conjunctival cysts, left eye: Secondary | ICD-10-CM | POA: Diagnosis not present

## 2023-10-12 DIAGNOSIS — H02889 Meibomian gland dysfunction of unspecified eye, unspecified eyelid: Secondary | ICD-10-CM | POA: Diagnosis not present

## 2023-10-12 DIAGNOSIS — H52223 Regular astigmatism, bilateral: Secondary | ICD-10-CM | POA: Diagnosis not present

## 2023-10-12 DIAGNOSIS — H524 Presbyopia: Secondary | ICD-10-CM | POA: Diagnosis not present

## 2023-10-12 DIAGNOSIS — H5213 Myopia, bilateral: Secondary | ICD-10-CM | POA: Diagnosis not present

## 2023-10-30 DIAGNOSIS — R634 Abnormal weight loss: Secondary | ICD-10-CM | POA: Diagnosis not present

## 2023-10-30 DIAGNOSIS — N3000 Acute cystitis without hematuria: Secondary | ICD-10-CM | POA: Diagnosis not present

## 2023-11-14 ENCOUNTER — Encounter: Payer: Self-pay | Admitting: Physical Medicine and Rehabilitation

## 2023-11-14 ENCOUNTER — Encounter
Payer: Medicare Other | Attending: Physical Medicine and Rehabilitation | Admitting: Physical Medicine and Rehabilitation

## 2023-11-14 VITALS — BP 108/75 | HR 85 | Ht 63.0 in | Wt 115.0 lb

## 2023-11-14 DIAGNOSIS — M542 Cervicalgia: Secondary | ICD-10-CM

## 2023-11-14 DIAGNOSIS — M25512 Pain in left shoulder: Secondary | ICD-10-CM | POA: Diagnosis not present

## 2023-11-14 DIAGNOSIS — M7918 Myalgia, other site: Secondary | ICD-10-CM

## 2023-11-14 DIAGNOSIS — M62838 Other muscle spasm: Secondary | ICD-10-CM | POA: Insufficient documentation

## 2023-11-14 DIAGNOSIS — G8929 Other chronic pain: Secondary | ICD-10-CM

## 2023-11-14 MED ORDER — MELOXICAM 7.5 MG PO TABS: 7.5000 mg | ORAL_TABLET | Freq: Every day | ORAL | 5 refills | Status: AC

## 2023-11-14 MED ORDER — TRIAMCINOLONE ACETONIDE 40 MG/ML IJ SUSP
10.0000 mg | Freq: Once | INTRAMUSCULAR | Status: AC
  Administered 2023-11-14: 10 mg via INTRAMUSCULAR

## 2023-11-14 MED ORDER — LIDOCAINE HCL 1 % IJ SOLN
3.0000 mL | Freq: Once | INTRAMUSCULAR | Status: AC
  Administered 2023-11-14: 3 mL

## 2023-11-14 NOTE — Patient Instructions (Addendum)
-   Resume Usual Activities. Notify Physician of any unusual bleeding, erythema or concern for side effects as reviewed above. - Apply ice prn for pain - Tylenol prn for pain - Follow up in  January for general follow up - Get xray ordered from last time - refilled meloxicam - Continue to abstain from marijuana; will get UDS at follow up to re-evaluate if we can resume her tramadol

## 2023-11-14 NOTE — Progress Notes (Signed)
Subjective:    Patient ID: Tammy Bryan, female    DOB: Apr 28, 1956, 67 y.o.   MRN: [id number]  HPI  Tammy Bryan is a 68 y.o. year old female  who  has a past medical history of Hyperlipidemia and Tuberculosis (1980).   They are presenting to PM&R clinic for acute pain in her left neck, starting Saturday  12/7 .  Plan from last visit:  Chronic pain syndrome Encounter for therapeutic drug monitoring Encounter for long-term methadone use -     ToxAssure Select,+Antidepr,UR   Today, you got a pain contract and a urine drug screen with [place].  As long as results are as expected, I will be refilling your tramadol 50 mg daily for the next 3 months.   we will attempt to wean off your gabapentin.  Start with 300 mg capsules twice daily for 3 days, then reduce to 1 capsule at nighttime for 3 days, then stop.  If you have recurrent of you shooting/burning pains in your leg, go back up to the lowest tolerable dose and message me through MyChart.    We will have a follow-up  in early January.  If any questions between now and then, call the clinic or message me through MyChart.   Myofascial muscle pain Muscle spasms of neck I will see you again in my earliest available appointment for trigger point injections.    I am starting you on a low-dose of baclofen 5 mg daily as needed, to take on severe pain days only.   Neck pain on left side -     DG Cervical Spine 2 or 3 views; Future   Continue meloxicam 7.5 mg daily.  You can use Voltaren gel over-the-counter at least twice daily for the next [redacted] weeks along with this, targeting your neck and shoulders.   I will be sending you to physical therapy to work on range of motion, stretching, and compensatory strategies to help with your neck pain; however, as you cannot accommodate this until January, we will leave it for your next appointment.   I will be getting x-rays of your neck to look for signs of arthritis.  I will message you results through  MyChart when they are available.   Interval Hx:  - On Friday got her christmas stuff out of the closet and was taking care of her 52 month old grandson. She was having to both bend over and reach up for boxes, some too heavy to lift. She also swept the deck that day.   - Pain is isolated to the left shoulder, stabbing, localized with some aching.    - Fell yesterday onto her carpet but no injury   - Medications: She says since coming down on her gabapentin her hot flashes have gotten worse.  Has needed tramadol BID and increased meloxicam for right neck pain, along with ambien nightly for sleep with the pain.   Has been using volatern gel twice a day and some ice for her pain.   Muscle relaxers have not been helpful.    - Other concerns: cervical xray? -Not done since last visit TPI form last time helped for a few days and then started hurting again.    Pain Inventory Average Pain 4 Pain Right Now 8 My pain is sharp and stabbing  In the last 24 hours, has pain interfered with the following? General activity 5 Relation with others 4 Enjoyment of life 7 What TIME of day is your pain at  its worst? daytime, evening, and night Sleep (in general) Fair  Pain is worse with: some activites Pain improves with: heat/ice, medication, and injections Relief from Meds: 7  Family History  Problem Relation Age of Onset   Breast cancer Maternal Grandmother    Social History   Socioeconomic History   Marital status: Widowed    Spouse name: Not on file   Number of children: Not on file   Years of education: Not on file   Highest education level: Not on file  Occupational History   Not on file  Tobacco Use   Smoking status: Never   Smokeless tobacco: Current   Tobacco comments:    medicinal vape at night  Vaping Use   Vaping status: Some Days   Substances: THC  Substance and Sexual Activity   Alcohol use: Yes    Comment: once or twice a month   Drug use: Not Currently     Types: Marijuana   Sexual activity: Never  Other Topics Concern   Not on file  Social History Narrative   Not on file   Social Determinants of Health   Financial Resource Strain: Not on file  Food Insecurity: Not on file  Transportation Needs: Not on file  Physical Activity: Not on file  Stress: Not on file  Social Connections: Not on file   No past surgical history on file. No past surgical history on file. Past Medical History:  Diagnosis Date   Hyperlipidemia    Tuberculosis 1980   BP 108/75   Pulse 85   Ht 5\' 3"  (1.6 m)   Wt 115 lb (52.2 kg)   SpO2 96%   BMI 20.37 kg/m   Opioid Risk Score:   Fall Risk Score:  `1  Depression screen [facility] 2/9     11/14/2023   10:01 AM 10/08/2023   11:27 AM 09/24/2023    2:37 PM  Depression screen PHQ 2/9  Decreased Interest 0 0 0  Down, Depressed, Hopeless 0 0 0  PHQ - 2 Score 0 0 0  Altered sleeping   1  Tired, decreased energy   1  Change in appetite   0  Feeling bad or failure about yourself    0  Trouble concentrating   1  Moving slowly or fidgety/restless   0  Suicidal thoughts   0  PHQ-9 Score   3  Difficult doing work/chores   Somewhat difficult     Review of Systems  Constitutional: Negative.   HENT: Negative.    Eyes: Negative.   Respiratory: Negative.    Cardiovascular: Negative.   Gastrointestinal: Negative.   Endocrine: Negative.   Genitourinary: Negative.   Musculoskeletal:  Positive for neck pain.  Skin: Negative.   Allergic/Immunologic: Negative.   Neurological: Negative.   Hematological: Negative.   Psychiatric/Behavioral: Negative.    All other systems reviewed and are negative.      Objective:   Physical Exam     PE: Constitution: Appropriate appearance for age. No apparent distress  Resp: No respiratory distress. No accessory muscle usage. on RA and CTAB Cardio: Well perfused appearance. No peripheral edema. Abdomen: Nondistended. Nontender.   Psych: Appropriate mood and  affect. Neuro: AAOx4. No apparent cognitive deficits    Neurologic Exam:   DTRs: Reflexes were 2+ in bilateral achilles, patella, biceps, BR and triceps. Babinsky: flexor responses b/l.   Hoffmans: negative b/l Sensory exam: revealed normal sensation in all dermatomal regions in bilateral upper extremities and bilateral lower extremities  Motor exam: strength 5/5 throughout bilateral upper extremities and bilateral lower extremities Coordination: Fine motor coordination was normal.   Gait: normal   MSK: AROM neck limited in extension, bilateral rotation and sidebending Tight muscles of neck and shoulders with + triggerpoints in left trapezius muscle No deformity or crepitis Shoulder special testing for rotator cuff pathology grossly normal.       Assessment & Plan:   Tammy Bryan is a 67 y.o. year old female  who  has a past medical history of Hyperlipidemia and Tuberculosis (1980).   They are presenting to PM&R clinic for an acute care visit for new left shoulder pain, following lifting/moving boxes for Christmas ornaments.  No acute trauma or injury.   Acute pain of left shoulder Myofascial muscle pain Muscle spasms of neck After discussion of exam findings, patient agreeable with trigger point injection as below:  PROCEDURE:  Left  trigger point injections Diagnosis: M79.18  Goals with treatment: [ x ] Decrease pain [ x ] Improve Active / Passive ROM [ x ] Improve ADLs [  ] Improve functional mobility  MEDICATION:  [ x ] Kenalog 40 mg/mL  [ X ] Lidocaine 1%    CONSENT: Obtained in writing per policy. Consent uploaded to chart.  Benefits discussed.  Risks discussed included, but were not limited to, pain and discomfort, bleeding, bruising, allergic reaction, infection. All questions answered to patient/family member/guardian/ caregiver satisfaction. They would like to proceed with procedure. There are no noted contraindications to procedure.  PROCEDURE Time out was  preformed No heat sources No antibiotics  The patient was explained about both the benefits and risks of a Left  trigger point injections. After the patient acknowledged an understanding of the risks and benefits, the patient agreed to proceed. The area was first marked and then prepped in an aseptic fashion with betadine / alcohol. A 30 g, 1/2 inch needle was directed via a direct approach into the Left trapezius muscle. The injection was completed with Kenalog 40 mg/ml 0.2 cc mixed with 3 cc of 1% lidocaine after no blood was aspirated on pull back.  No complications were encountered. The patient tolerated the procedure well.  Patient/Care Kathleen Lime was ready to learn without apparent learning barriers. Education was provided on diagnosis, treatment options/plan according to patient's preferred learning style. Patient/Care Giver verbalized understanding and agreement with the above plan.   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  Discussed this with the patient, she wishes for neurosurgical referral given persistence of symptoms over several years, not responsive to conservative therapy.  Did discuss plan is still to repeat PT in January.  - refilled meloxicam  - Continue to abstain from marijuana; will get UDS at follow up to re-evaluate if we can resume her tramadol.  Patient understands that she must remain compliant with abstaining from marijuana moving forward once she is under a pain contract.  Other orders -     Lidocaine HCl -     Triamcinolone Acetonide -     Meloxicam; Take 1 tablet (7.5 mg total) by mouth daily.  Dispense: 30 tablet; Refill: 5

## 2023-11-15 ENCOUNTER — Ambulatory Visit
Admission: RE | Admit: 2023-11-15 | Discharge: 2023-11-15 | Disposition: A | Discharge status: Home / Self Care | Payer: Medicare Other | Source: Ambulatory Visit | Attending: Physical Medicine and Rehabilitation | Admitting: Physical Medicine and Rehabilitation

## 2023-11-15 DIAGNOSIS — M542 Cervicalgia: Secondary | ICD-10-CM | POA: Diagnosis not present

## 2023-11-15 DIAGNOSIS — G8929 Other chronic pain: Secondary | ICD-10-CM | POA: Diagnosis not present

## 2023-11-18 ENCOUNTER — Encounter: Payer: Self-pay | Admitting: Physical Medicine and Rehabilitation

## 2023-11-18 DIAGNOSIS — M5030 Other cervical disc degeneration, unspecified cervical region: Secondary | ICD-10-CM

## 2023-11-18 DIAGNOSIS — G894 Chronic pain syndrome: Secondary | ICD-10-CM

## 2023-11-18 DIAGNOSIS — M542 Cervicalgia: Secondary | ICD-10-CM

## 2023-11-19 MED ORDER — GABAPENTIN 300 MG PO CAPS: 300.0000 mg | ORAL_CAPSULE | Freq: Three times a day (TID) | ORAL | 5 refills | Status: AC

## 2023-11-20 NOTE — Addendum Note (Signed)
Addended by: Elijah Birk on: 11/20/2023 12:24 PM   Modules accepted: Orders

## 2023-12-10 DIAGNOSIS — M542 Cervicalgia: Secondary | ICD-10-CM | POA: Diagnosis not present

## 2023-12-10 DIAGNOSIS — M5030 Other cervical disc degeneration, unspecified cervical region: Secondary | ICD-10-CM | POA: Diagnosis not present

## 2023-12-11 DIAGNOSIS — M5030 Other cervical disc degeneration, unspecified cervical region: Secondary | ICD-10-CM | POA: Insufficient documentation

## 2023-12-11 DIAGNOSIS — Z79899 Other long term (current) drug therapy: Secondary | ICD-10-CM | POA: Diagnosis not present

## 2023-12-11 DIAGNOSIS — R7309 Other abnormal glucose: Secondary | ICD-10-CM | POA: Diagnosis not present

## 2023-12-11 NOTE — Addendum Note (Signed)
 Addended by: Elijah Birk on: 12/11/2023 11:34 PM   Modules accepted: Orders

## 2023-12-12 ENCOUNTER — Ambulatory Visit: Payer: Medicare Other | Admitting: Physical Medicine and Rehabilitation

## 2023-12-12 ENCOUNTER — Encounter
Payer: Medicare Other | Attending: Physical Medicine and Rehabilitation | Admitting: Physical Medicine and Rehabilitation

## 2023-12-12 VITALS — BP 136/79 | HR 90 | Ht 63.0 in | Wt 114.0 lb

## 2023-12-12 DIAGNOSIS — M62838 Other muscle spasm: Secondary | ICD-10-CM | POA: Insufficient documentation

## 2023-12-12 DIAGNOSIS — G894 Chronic pain syndrome: Secondary | ICD-10-CM | POA: Insufficient documentation

## 2023-12-12 DIAGNOSIS — Z79891 Long term (current) use of opiate analgesic: Secondary | ICD-10-CM | POA: Diagnosis not present

## 2023-12-12 DIAGNOSIS — Z5181 Encounter for therapeutic drug level monitoring: Secondary | ICD-10-CM | POA: Diagnosis not present

## 2023-12-12 DIAGNOSIS — M5030 Other cervical disc degeneration, unspecified cervical region: Secondary | ICD-10-CM | POA: Diagnosis not present

## 2023-12-12 MED ORDER — CYCLOBENZAPRINE HCL 5 MG PO TABS: 5.0000 mg | ORAL_TABLET | Freq: Three times a day (TID) | ORAL | 3 refills | Status: AC | PRN

## 2023-12-12 MED ORDER — TRAMADOL HCL 50 MG PO TABS
50.0000 mg | ORAL_TABLET | Freq: Two times a day (BID) | ORAL | 5 refills | Status: AC | PRN
Start: 2023-12-12 — End: ?
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
## 2023-12-12 NOTE — Patient Instructions (Addendum)
 Today I refilled your tramadol  for 6 months  Continue management with Dr. Debby including upcoming MRI; if approved by neurosurgery, start PT after this test is done. If you do get PT, ask for a vestibular evaluation as well due to symptoms seen in the office today.   Let me know if they decide to perform surgery  I have added flexeril  up to three times daily for muscle spasms to use as needed; also use head and massage for neck and shoulder tightness.  If you continue meloxicam , I will get labwork on you to check kidney function at our 3 month follow up.

## 2023-12-13 ENCOUNTER — Other Ambulatory Visit (HOSPITAL_COMMUNITY): Payer: Self-pay | Admitting: Family Medicine

## 2023-12-13 DIAGNOSIS — E871 Hypo-osmolality and hyponatremia: Secondary | ICD-10-CM | POA: Diagnosis not present

## 2023-12-13 DIAGNOSIS — R17 Unspecified jaundice: Secondary | ICD-10-CM | POA: Diagnosis not present

## 2023-12-13 DIAGNOSIS — Z79899 Other long term (current) drug therapy: Secondary | ICD-10-CM | POA: Diagnosis not present

## 2023-12-13 DIAGNOSIS — G4700 Insomnia, unspecified: Secondary | ICD-10-CM | POA: Diagnosis not present

## 2023-12-13 DIAGNOSIS — B009 Herpesviral infection, unspecified: Secondary | ICD-10-CM | POA: Diagnosis not present

## 2023-12-13 DIAGNOSIS — R7309 Other abnormal glucose: Secondary | ICD-10-CM | POA: Diagnosis not present

## 2023-12-14 ENCOUNTER — Ambulatory Visit (HOSPITAL_COMMUNITY)
Admission: RE | Admit: 2023-12-14 | Discharge: 2023-12-14 | Disposition: A | Discharge status: Home / Self Care | Payer: Medicare Other | Source: Ambulatory Visit | Attending: Family Medicine

## 2023-12-14 DIAGNOSIS — R17 Unspecified jaundice: Secondary | ICD-10-CM | POA: Diagnosis not present

## 2023-12-17 ENCOUNTER — Other Ambulatory Visit (HOSPITAL_COMMUNITY): Payer: Self-pay | Admitting: Family Medicine

## 2023-12-17 ENCOUNTER — Ambulatory Visit (HOSPITAL_COMMUNITY)
Admission: RE | Admit: 2023-12-17 | Discharge: 2023-12-17 | Disposition: A | Discharge status: Home / Self Care | Payer: Medicare Other | Source: Ambulatory Visit | Attending: Family Medicine | Admitting: Family Medicine

## 2023-12-17 DIAGNOSIS — R634 Abnormal weight loss: Secondary | ICD-10-CM

## 2023-12-17 DIAGNOSIS — R17 Unspecified jaundice: Secondary | ICD-10-CM

## 2023-12-17 DIAGNOSIS — K838 Other specified diseases of biliary tract: Secondary | ICD-10-CM | POA: Insufficient documentation

## 2023-12-17 MED ORDER — GADOBUTROL 1 MMOL/ML IV SOLN
5.0000 mL | Freq: Once | INTRAVENOUS | Status: AC | PRN
  Administered 2023-12-17: 5 mL via INTRAVENOUS

## 2023-12-20 DIAGNOSIS — R634 Abnormal weight loss: Secondary | ICD-10-CM | POA: Diagnosis not present

## 2023-12-20 DIAGNOSIS — K838 Other specified diseases of biliary tract: Secondary | ICD-10-CM | POA: Diagnosis not present

## 2023-12-25 DIAGNOSIS — M542 Cervicalgia: Secondary | ICD-10-CM | POA: Diagnosis not present

## 2023-12-25 DIAGNOSIS — M47812 Spondylosis without myelopathy or radiculopathy, cervical region: Secondary | ICD-10-CM | POA: Diagnosis not present

## 2023-12-25 DIAGNOSIS — M5021 Other cervical disc displacement,  high cervical region: Secondary | ICD-10-CM | POA: Diagnosis not present

## 2023-12-25 DIAGNOSIS — M4802 Spinal stenosis, cervical region: Secondary | ICD-10-CM | POA: Diagnosis not present

## 2023-12-25 DIAGNOSIS — M50223 Other cervical disc displacement at C6-C7 level: Secondary | ICD-10-CM | POA: Diagnosis not present

## 2023-12-31 DIAGNOSIS — E871 Hypo-osmolality and hyponatremia: Secondary | ICD-10-CM | POA: Diagnosis not present

## 2024-01-07 ENCOUNTER — Other Ambulatory Visit: Payer: Self-pay | Admitting: Gastroenterology

## 2024-01-31 ENCOUNTER — Encounter (HOSPITAL_COMMUNITY): Payer: Self-pay | Admitting: Gastroenterology

## 2024-02-05 NOTE — Anesthesia Preprocedure Evaluation (Signed)
 Anesthesia Evaluation  Patient identified by MRN, date of birth, ID band Patient awake    Reviewed: Allergy & Precautions, NPO status , Patient's Chart, lab work & pertinent test results  History of Anesthesia Complications Negative for: history of anesthetic complications  Airway Mallampati: II  TM Distance: >3 FB Neck ROM: Full    Dental  (+) Dental Advisory Given   Pulmonary neg shortness of breath, neg sleep apnea, neg COPD, neg recent URI H/o tuberculosis in 1980   Pulmonary exam normal breath sounds clear to auscultation       Cardiovascular (-) hypertension(-) angina (-) Past MI, (-) Cardiac Stents and (-) CABG (-) dysrhythmias  Rhythm:Regular Rate:Normal  HLD  Normal stress test 08/18/2020  TTE 08/12/2020: IMPRESSIONS    1. Left ventricular ejection fraction, by estimation, is 60 to 65%. The  left ventricle has normal function. The left ventricle has no regional  wall motion abnormalities. Left ventricular diastolic parameters are  consistent with Grade I diastolic  dysfunction (impaired relaxation). GLS -19.3%, normal.   2. Right ventricular systolic function is normal. The right ventricular  size is normal. There is normal pulmonary artery systolic pressure. The  estimated right ventricular systolic pressure is 23.6 mmHg.   3. The mitral valve is normal in structure. No evidence of mitral valve  regurgitation. No evidence of mitral stenosis.   4. The aortic valve is tricuspid. Aortic valve regurgitation is not  visualized. Mild aortic valve sclerosis is present, with no evidence of  aortic valve stenosis.   5. The inferior vena cava is normal in size with greater than 50%  respiratory variability, suggesting right atrial pressure of 3 mmHg.     Neuro/Psych negative neurological ROS     GI/Hepatic Neg liver ROS,neg GERD  ,,Dilated bile duct   Endo/Other  negative endocrine ROS    Renal/GU negative Renal ROS      Musculoskeletal  (+) Arthritis ,    Abdominal   Peds  Hematology negative hematology ROS (+)   Anesthesia Other Findings   Reproductive/Obstetrics                             Anesthesia Physical Anesthesia Plan  ASA: 2  Anesthesia Plan: MAC   Post-op Pain Management: Minimal or no pain anticipated   Induction: Intravenous  PONV Risk Score and Plan: 2 and Propofol infusion, TIVA and Treatment may vary due to age or medical condition  Airway Management Planned: Natural Airway and Nasal Cannula  Additional Equipment:   Intra-op Plan:   Post-operative Plan:   Informed Consent: I have reviewed the patients History and Physical, chart, labs and discussed the procedure including the risks, benefits and alternatives for the proposed anesthesia with the patient or authorized representative who has indicated his/her understanding and acceptance.     Dental advisory given  Plan Discussed with: CRNA and Anesthesiologist  Anesthesia Plan Comments: (Discussed with patient risks of MAC including, but not limited to, minor pain or discomfort, hearing people in the room, and possible need for backup general anesthesia. Risks for general anesthesia also discussed including, but not limited to, sore throat, hoarse voice, chipped/damaged teeth, injury to vocal cords, nausea and vomiting, allergic reactions, lung infection, heart attack, stroke, and death. All questions answered. )       Anesthesia Quick Evaluation

## 2024-02-06 ENCOUNTER — Encounter (HOSPITAL_COMMUNITY)
Admission: RE | Disposition: A | Discharge status: Home / Self Care | Payer: Self-pay | Source: Home / Self Care | Attending: Gastroenterology

## 2024-02-06 ENCOUNTER — Ambulatory Visit (HOSPITAL_BASED_OUTPATIENT_CLINIC_OR_DEPARTMENT_OTHER): Payer: Self-pay | Admitting: Anesthesiology

## 2024-02-06 ENCOUNTER — Ambulatory Visit (HOSPITAL_COMMUNITY)
Admission: RE | Admit: 2024-02-06 | Discharge: 2024-02-06 | Disposition: A | Discharge status: Home / Self Care | Payer: Medicare Other | Attending: Gastroenterology | Admitting: Gastroenterology

## 2024-02-06 ENCOUNTER — Ambulatory Visit (HOSPITAL_COMMUNITY): Payer: Self-pay | Admitting: Anesthesiology

## 2024-02-06 DIAGNOSIS — K838 Other specified diseases of biliary tract: Secondary | ICD-10-CM

## 2024-02-06 DIAGNOSIS — M1991 Primary osteoarthritis, unspecified site: Secondary | ICD-10-CM | POA: Insufficient documentation

## 2024-02-06 DIAGNOSIS — K839 Disease of biliary tract, unspecified: Secondary | ICD-10-CM | POA: Diagnosis not present

## 2024-02-06 DIAGNOSIS — E785 Hyperlipidemia, unspecified: Secondary | ICD-10-CM | POA: Diagnosis not present

## 2024-02-06 DIAGNOSIS — I358 Other nonrheumatic aortic valve disorders: Secondary | ICD-10-CM | POA: Insufficient documentation

## 2024-02-06 HISTORY — PX: ESOPHAGOGASTRODUODENOSCOPY: SHX5428

## 2024-02-06 HISTORY — PX: UPPER ESOPHAGEAL ENDOSCOPIC ULTRASOUND (EUS): SHX6562

## 2024-02-06 SURGERY — UPPER ESOPHAGEAL ENDOSCOPIC ULTRASOUND (EUS)
Anesthesia: Monitor Anesthesia Care

## 2024-02-06 MED ORDER — PROPOFOL 500 MG/50ML IV EMUL
INTRAVENOUS | Status: DC | PRN
  Administered 2024-02-06: 135 ug/kg/min via INTRAVENOUS

## 2024-02-06 MED ORDER — ONDANSETRON HCL 4 MG/2ML IJ SOLN
INTRAMUSCULAR | Status: DC | PRN
  Administered 2024-02-06: 4 mg via INTRAVENOUS

## 2024-02-06 MED ORDER — PROPOFOL 1000 MG/100ML IV EMUL
INTRAVENOUS | Status: AC
  Filled 2024-02-06: qty 100

## 2024-02-06 MED ORDER — PROPOFOL 10 MG/ML IV BOLUS
INTRAVENOUS | Status: DC | PRN
  Administered 2024-02-06: 40 mg via INTRAVENOUS

## 2024-02-06 MED ORDER — SODIUM CHLORIDE 0.9 % IV SOLN: INTRAVENOUS | Status: DC | PRN

## 2024-02-06 MED ORDER — EPHEDRINE SULFATE-NACL 50-0.9 MG/10ML-% IV SOSY
PREFILLED_SYRINGE | INTRAVENOUS | Status: DC | PRN
  Administered 2024-02-06: 10 mg via INTRAVENOUS

## 2024-02-06 NOTE — Anesthesia Procedure Notes (Signed)
 Procedure Name: MAC Date/Time: 02/06/2024 9:22 AM  Performed by: Nelle Don, CRNAPre-anesthesia Checklist: Patient identified, Emergency Drugs available, Suction available and Patient being monitored Oxygen Delivery Method: Simple face mask

## 2024-02-06 NOTE — H&P (Signed)
 [facility] H/P Note  Chief Complaint: dilated bile duct, weight loss  HPI: Tammy Bryan is an 68 y.o. female.  Presenting dilated bile duct (8mm) with normal LFTs and 10-lb unintentional weight loss.  MRCP prominent bile duct without obvious choledocholithiasis or ampullary/pancreatic/bile duct mass/lesion.  Past Medical History:  Diagnosis Date   Hyperlipidemia    Tuberculosis 1980    History reviewed. No pertinent surgical history.  Medications Prior to Admission  Medication Sig Dispense Refill   AMBIEN 5 MG tablet 1 tablet at bedtime Orally Once a day as needed for 30 days     atorvastatin (LIPITOR) 20 MG tablet Take by mouth.     cyclobenzaprine (FLEXERIL) 5 MG tablet Take 1 tablet (5 mg total) by mouth 3 (three) times daily as needed for muscle spasms. 30 tablet 3   gabapentin (NEURONTIN) 300 MG capsule Take 1 capsule (300 mg total) by mouth 3 (three) times daily. 90 capsule 5   meloxicam (MOBIC) 7.5 MG tablet Take 1 tablet (7.5 mg total) by mouth daily. (Patient taking differently: Take 15 mg by mouth daily.) 30 tablet 5   traMADol (ULTRAM) 50 MG tablet Take 1 tablet (50 mg total) by mouth every 12 (twelve) hours as needed for moderate pain (pain score 4-6) or severe pain (pain score 7-10). 60 tablet 5    Allergies: No Known Allergies  Family History  Problem Relation Age of Onset   Breast cancer Maternal Grandmother     Social History:  reports that she has never smoked. She uses smokeless tobacco. She reports current alcohol use. She reports that she does not currently use drugs after having used the following drugs: Marijuana.   ROS: As per HPI, all others negative   Blood pressure (!) 148/82, pulse 82, temperature (!) 97.5 F (36.4 C), temperature source Temporal, resp. rate 10, height 5\' 3"  (1.6 m), weight 51.3 kg, SpO2 98%. General appearance: Thin but not cachectic-appearing, NAD HEENT:  [place]/AT, anicteric NECK:  Supple CV:  Regular RESP:  No visible  distress ABD:  Soft, non-tender NEURO:  No encephalopathy  No results found for this or any previous visit (from the past 48 hours). No results found.  Assessment/Plan   Weight loss, unintentional. Dilated bile duct with normal LFTs. Endoscopy and upper endoscopy with ultrasound with possible fine needle aspiration. Risks (bleeding, infection, bowel perforation that could require surgery, sedation-related changes in cardiopulmonary systems), benefits (identification and possible treatment of source of symptoms, exclusion of certain causes of symptoms), and alternatives (watchful waiting, radiographic imaging studies, empiric medical treatment) of upper endoscopy and upper endoscopic ultrasound with possible fine needle aspiration (EGD + EUS +/- FNA) were explained to patient/family in detail and patient wishes to proceed.   Freddy Jaksch 02/06/2024, 9:20 AM

## 2024-02-06 NOTE — Transfer of Care (Signed)
 Immediate Anesthesia Transfer of Care Note  Patient: Tammy Bryan  Procedure(s) Performed: UPPER ESOPHAGEAL ENDOSCOPIC ULTRASOUND (EUS) EGD (ESOPHAGOGASTRODUODENOSCOPY)  Patient Location: PACU  Anesthesia Type:MAC  Level of Consciousness: drowsy  Airway & Oxygen Therapy: Patient Spontanous Breathing and Patient connected to face mask oxygen  Post-op Assessment: Report given to RN, Post -op Vital signs reviewed and stable, and Patient moving all extremities X 4  Post vital signs: Reviewed and stable  Last Vitals:  Vitals Value Taken Time  BP 130/73 02/06/24 1000  Temp    Pulse 86 02/06/24 1000  Resp 13 02/06/24 1000  SpO2 100 % 02/06/24 1000  Vitals shown include unfiled device data.  Last Pain:  Vitals:   02/06/24 0838  TempSrc: Temporal         Complications: No notable events documented.

## 2024-02-06 NOTE — Anesthesia Postprocedure Evaluation (Signed)
 Anesthesia Post Note  Patient: Tammy Bryan  Procedure(s) Performed: UPPER ESOPHAGEAL ENDOSCOPIC ULTRASOUND (EUS) EGD (ESOPHAGOGASTRODUODENOSCOPY)     Patient location during evaluation: PACU Anesthesia Type: MAC Level of consciousness: awake Pain management: pain level controlled Vital Signs Assessment: post-procedure vital signs reviewed and stable Respiratory status: spontaneous breathing, nonlabored ventilation and respiratory function stable Cardiovascular status: stable and blood pressure returned to baseline Postop Assessment: no apparent nausea or vomiting Anesthetic complications: no   No notable events documented.  Last Vitals:  Vitals:   02/06/24 1010 02/06/24 1020  BP: 131/78 137/75  Pulse: 78 77  Resp: 14 15  Temp:    SpO2: 100% 96%    Last Pain:  Vitals:   02/06/24 1020  TempSrc:   PainSc: 0-No pain                 Linton Rump

## 2024-02-06 NOTE — Discharge Instructions (Addendum)

## 2024-02-06 NOTE — Op Note (Signed)
 [facility] Patient Name: Tammy Bryan Procedure Date: 02/06/2024 MRN: [id number] Attending MD: Willis Modena , MD, [id number] Date of Birth: December 31, 1955 CSN: [id number] Age: 68 Admit Type: Outpatient Procedure:                Upper EUS Indications:              Common bile duct dilation (acquired) seen on MRI Providers:                Willis Modena, MD, Marge Duncans, RN, Alan Ripper,                            Technician Referring MD:             Jonette Mate, MD Medicines:                Monitored Anesthesia Care Complications:            No immediate complications. Estimated Blood Loss:     Estimated blood loss: none. Procedure:                Pre-Anesthesia Assessment:                           - Prior to the procedure, a History and Physical                            was performed, and patient medications and                            allergies were reviewed. The patient's tolerance of                            previous anesthesia was also reviewed. The risks                            and benefits of the procedure and the sedation                            options and risks were discussed with the patient.                            All questions were answered, and informed consent                            was obtained. Prior Anticoagulants: The patient has                            taken no anticoagulant or antiplatelet agents. ASA                            Grade Assessment: II - A patient with mild systemic                            disease. After reviewing the risks and benefits,  the patient was deemed in satisfactory condition to                            undergo the procedure.                           After obtaining informed consent, the endoscope was                            passed under direct vision. Throughout the                            procedure, the patient's blood pressure, pulse, and                             oxygen saturations were monitored continuously. The                            [id number]-[id number] ([id number]) Olympus endoscope was introduced                            through the mouth, and advanced to the second part                            of duodenum. The [id number] ([id number]) Olympus                            radial ultrasound scope was introduced through the                            mouth, and advanced to the second part of duodenum.                            The upper EUS was accomplished without difficulty.                            The patient tolerated the procedure well. Scope In: Scope Out: Findings:      ENDOSCOPIC FINDING: :      The examined esophagus was normal.      The entire examined stomach was normal.      The ampulla, duodenal bulb, first portion of the duodenum and second       portion of the duodenum were normal.      ENDOSONOGRAPHIC FINDING: :      No lymphadenopathy seen.      There was no sign of significant endosonographic abnormality in the       pancreatic head, genu of the pancreas, pancreatic body, pancreatic tail       and uncinate process of the pancreas. No pancreatic mass identified. The       pancreatic duct measured up to 4 mm in diameter.      There was dilation in the common bile duct which measured up to 8 mm       which smoothly tapered all the way to the level of the head of pancreas.      There was no sign  of significant endosonographic abnormality in the left       lobe of the liver. Homogeneous parenchyma was identified. Impression:               - Normal esophagus.                           - Normal stomach.                           - Normal ampulla, duodenal bulb, first portion of                            the duodenum and second portion of the duodenum.                           - There was no sign of significant pathology in the                            pancreatic head, genu of the pancreas, pancreatic                             body, pancreatic tail and uncinate process of the                            pancreas.                           - There was dilation in the common bile duct which                            measured up to 8 mm.                           - There was no evidence of significant pathology in                            the left lobe of the liver.                           - No specimens collected. Moderate Sedation:      Not Applicable - Patient had care per Anesthesia. Recommendation:           - Discharge patient to home (via wheelchair).                           - Resume previous diet today.                           - Continue present medications.                           - I am mindful of patient's symptoms of weight loss                            and her prominent bile duct and  pancreatic duct. I                            looked extensively throughout the pancreatic and                            ampullary regions and the entire common bile duct                            and common hepatic duct. I did not see a pancreatic                            or ampulalry mass. Her LFTs are normal. While there                            are rare instances of isoechoic pancreatic lesions,                            given patient's normal LFTs and my ability to tract                            the bile duct distally all the way to the ampulla,                            I felt this is unlikely and did not biopsy the area.                           - I would advise close follow-up with repeat LFTs                            and repeat MRI/MRCP in 3 months. If change in                            appearance on MRI and/or increase in LFTs, would                            consider repeat EUS and empiric biopsy of head of                            pancreas area even if remains isoechoic.                           - Return to GI clinic at appointment to be                             scheduled. Procedure Code(s):        --- Professional ---                           [phone], Esophagogastroduodenoscopy, flexible,                            transoral; with endoscopic ultrasound  examination,                            including the esophagus, stomach, and either the                            duodenum or a surgically altered stomach where the                            jejunum is examined distal to the anastomosis Diagnosis Code(s):        --- Professional ---                           K83.8, Other specified diseases of biliary tract CPT copyright 2022 [employer]. All rights reserved. The codes documented in this report are preliminary and upon coder review may  be revised to meet current compliance requirements. Willis Modena, MD 02/06/2024 10:12:03 AM This report has been signed electronically. Number of Addenda: 0

## 2024-02-10 ENCOUNTER — Encounter (HOSPITAL_COMMUNITY): Payer: Self-pay | Admitting: Gastroenterology

## 2024-02-13 ENCOUNTER — Ambulatory Visit
Admission: RE | Admit: 2024-02-13 | Discharge: 2024-02-13 | Disposition: A | Discharge status: Home / Self Care | Payer: Medicare Other | Source: Ambulatory Visit | Attending: Family Medicine

## 2024-02-13 DIAGNOSIS — M8588 Other specified disorders of bone density and structure, other site: Secondary | ICD-10-CM | POA: Diagnosis not present

## 2024-02-13 DIAGNOSIS — E2839 Other primary ovarian failure: Secondary | ICD-10-CM

## 2024-02-13 DIAGNOSIS — N958 Other specified menopausal and perimenopausal disorders: Secondary | ICD-10-CM | POA: Diagnosis not present

## 2024-02-18 DIAGNOSIS — M542 Cervicalgia: Secondary | ICD-10-CM | POA: Diagnosis not present

## 2024-02-18 DIAGNOSIS — M5030 Other cervical disc degeneration, unspecified cervical region: Secondary | ICD-10-CM | POA: Diagnosis not present

## 2024-03-04 DIAGNOSIS — H5213 Myopia, bilateral: Secondary | ICD-10-CM | POA: Diagnosis not present

## 2024-03-04 DIAGNOSIS — H524 Presbyopia: Secondary | ICD-10-CM | POA: Diagnosis not present

## 2024-03-04 DIAGNOSIS — H02889 Meibomian gland dysfunction of unspecified eye, unspecified eyelid: Secondary | ICD-10-CM | POA: Diagnosis not present

## 2024-03-04 DIAGNOSIS — H52223 Regular astigmatism, bilateral: Secondary | ICD-10-CM | POA: Diagnosis not present

## 2024-03-12 ENCOUNTER — Encounter: Payer: Medicare Other | Admitting: Physical Medicine and Rehabilitation

## 2024-03-25 DIAGNOSIS — K838 Other specified diseases of biliary tract: Secondary | ICD-10-CM | POA: Diagnosis not present

## 2024-05-16 DIAGNOSIS — Z0000 Encounter for general adult medical examination without abnormal findings: Secondary | ICD-10-CM | POA: Diagnosis not present

## 2024-05-16 DIAGNOSIS — R7303 Prediabetes: Secondary | ICD-10-CM | POA: Diagnosis not present

## 2024-05-16 DIAGNOSIS — M542 Cervicalgia: Secondary | ICD-10-CM | POA: Diagnosis not present

## 2024-05-16 DIAGNOSIS — G8929 Other chronic pain: Secondary | ICD-10-CM | POA: Diagnosis not present

## 2024-05-16 DIAGNOSIS — E785 Hyperlipidemia, unspecified: Secondary | ICD-10-CM | POA: Diagnosis not present

## 2024-05-16 DIAGNOSIS — Z79899 Other long term (current) drug therapy: Secondary | ICD-10-CM | POA: Diagnosis not present

## 2024-05-16 DIAGNOSIS — F5101 Primary insomnia: Secondary | ICD-10-CM | POA: Diagnosis not present

## 2024-06-18 ENCOUNTER — Other Ambulatory Visit: Payer: Self-pay | Admitting: Physical Medicine and Rehabilitation

## 2024-06-19 NOTE — Telephone Encounter (Signed)
 Please call patient to confirm up follow up appointment for refills; chart indicates she is/may have already moved out of state.

## 2024-06-20 ENCOUNTER — Other Ambulatory Visit: Payer: Self-pay | Admitting: Physical Medicine and Rehabilitation
